# Patient Record
Sex: Female | Born: 1980
Health system: Southern US, Community
[De-identification: ages and names within clinical notes are randomized; demographics above are authoritative.]

## PROBLEM LIST (undated history)

## (undated) DIAGNOSIS — Z789 Other specified health status: Secondary | ICD-10-CM

## (undated) DIAGNOSIS — E669 Obesity, unspecified: Secondary | ICD-10-CM

---
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
## 2000-01-08 ENCOUNTER — Emergency Department (HOSPITAL_COMMUNITY): Admission: EM | Admit: 2000-01-08 | Discharge: 2000-01-08 | Payer: Self-pay | Admitting: Emergency Medicine

## 2000-02-08 ENCOUNTER — Emergency Department (HOSPITAL_COMMUNITY): Admission: EM | Admit: 2000-02-08 | Discharge: 2000-02-09 | Payer: Self-pay | Admitting: Emergency Medicine

## 2000-04-01 ENCOUNTER — Emergency Department (HOSPITAL_COMMUNITY): Admission: EM | Admit: 2000-04-01 | Discharge: 2000-04-02 | Payer: Self-pay

## 2000-04-24 ENCOUNTER — Emergency Department (HOSPITAL_COMMUNITY): Admission: EM | Admit: 2000-04-24 | Discharge: 2000-04-24 | Payer: Self-pay | Admitting: Emergency Medicine

## 2000-06-24 ENCOUNTER — Emergency Department (HOSPITAL_COMMUNITY): Admission: EM | Admit: 2000-06-24 | Discharge: 2000-06-24 | Payer: Self-pay | Admitting: Emergency Medicine

## 2000-06-24 ENCOUNTER — Encounter: Payer: Self-pay | Admitting: Emergency Medicine

## 2000-10-05 ENCOUNTER — Emergency Department (HOSPITAL_COMMUNITY): Admission: EM | Admit: 2000-10-05 | Discharge: 2000-10-05 | Payer: Self-pay | Admitting: Emergency Medicine

## 2000-10-14 ENCOUNTER — Emergency Department (HOSPITAL_COMMUNITY): Admission: EM | Admit: 2000-10-14 | Discharge: 2000-10-14 | Payer: Self-pay | Admitting: Emergency Medicine

## 2001-04-08 ENCOUNTER — Encounter: Payer: Self-pay | Admitting: Emergency Medicine

## 2001-04-08 ENCOUNTER — Emergency Department (HOSPITAL_COMMUNITY): Admission: EM | Admit: 2001-04-08 | Discharge: 2001-04-08 | Payer: Self-pay | Admitting: Emergency Medicine

## 2001-05-24 ENCOUNTER — Emergency Department (HOSPITAL_COMMUNITY): Admission: EM | Admit: 2001-05-24 | Discharge: 2001-05-24 | Payer: Self-pay | Admitting: Emergency Medicine

## 2001-09-12 ENCOUNTER — Emergency Department (HOSPITAL_COMMUNITY): Admission: EM | Admit: 2001-09-12 | Discharge: 2001-09-12 | Payer: Self-pay | Admitting: Emergency Medicine

## 2001-09-12 ENCOUNTER — Encounter: Payer: Self-pay | Admitting: Emergency Medicine

## 2001-09-14 ENCOUNTER — Emergency Department (HOSPITAL_COMMUNITY): Admission: EM | Admit: 2001-09-14 | Discharge: 2001-09-14 | Payer: Self-pay | Admitting: Emergency Medicine

## 2001-11-18 ENCOUNTER — Emergency Department (HOSPITAL_COMMUNITY): Admission: EM | Admit: 2001-11-18 | Discharge: 2001-11-18 | Payer: Self-pay | Admitting: Emergency Medicine

## 2002-05-08 ENCOUNTER — Encounter: Payer: Self-pay | Admitting: Emergency Medicine

## 2002-05-08 ENCOUNTER — Emergency Department (HOSPITAL_COMMUNITY): Admission: EM | Admit: 2002-05-08 | Discharge: 2002-05-08 | Payer: Self-pay | Admitting: Emergency Medicine

## 2002-05-11 ENCOUNTER — Emergency Department (HOSPITAL_COMMUNITY): Admission: EM | Admit: 2002-05-11 | Discharge: 2002-05-11 | Payer: Self-pay | Admitting: Emergency Medicine

## 2002-06-02 ENCOUNTER — Emergency Department (HOSPITAL_COMMUNITY): Admission: EM | Admit: 2002-06-02 | Discharge: 2002-06-02 | Payer: Self-pay | Admitting: Emergency Medicine

## 2002-10-09 ENCOUNTER — Emergency Department (HOSPITAL_COMMUNITY): Admission: EM | Admit: 2002-10-09 | Discharge: 2002-10-09 | Payer: Self-pay

## 2002-10-14 ENCOUNTER — Inpatient Hospital Stay (HOSPITAL_COMMUNITY): Admission: AD | Admit: 2002-10-14 | Discharge: 2002-10-14 | Payer: Self-pay | Admitting: Obstetrics & Gynecology

## 2002-10-18 ENCOUNTER — Inpatient Hospital Stay (HOSPITAL_COMMUNITY): Admission: AD | Admit: 2002-10-18 | Discharge: 2002-10-19 | Payer: Self-pay | Admitting: Obstetrics and Gynecology

## 2002-10-19 ENCOUNTER — Encounter: Payer: Self-pay | Admitting: Obstetrics and Gynecology

## 2002-11-17 ENCOUNTER — Inpatient Hospital Stay (HOSPITAL_COMMUNITY): Admission: AD | Admit: 2002-11-17 | Discharge: 2002-11-17 | Payer: Self-pay | Admitting: Obstetrics & Gynecology

## 2002-11-22 ENCOUNTER — Inpatient Hospital Stay (HOSPITAL_COMMUNITY): Admission: AD | Admit: 2002-11-22 | Discharge: 2002-11-22 | Payer: Self-pay | Admitting: *Deleted

## 2002-12-27 ENCOUNTER — Ambulatory Visit (HOSPITAL_COMMUNITY): Admission: RE | Admit: 2002-12-27 | Discharge: 2002-12-27 | Payer: Self-pay | Admitting: *Deleted

## 2003-02-13 ENCOUNTER — Inpatient Hospital Stay (HOSPITAL_COMMUNITY): Admission: AD | Admit: 2003-02-13 | Discharge: 2003-02-13 | Payer: Self-pay | Admitting: *Deleted

## 2003-02-14 ENCOUNTER — Inpatient Hospital Stay (HOSPITAL_COMMUNITY): Admission: AD | Admit: 2003-02-14 | Discharge: 2003-02-14 | Payer: Self-pay | Admitting: Obstetrics and Gynecology

## 2003-03-01 ENCOUNTER — Inpatient Hospital Stay (HOSPITAL_COMMUNITY): Admission: AD | Admit: 2003-03-01 | Discharge: 2003-03-02 | Payer: Self-pay | Admitting: Obstetrics & Gynecology

## 2003-03-27 ENCOUNTER — Observation Stay (HOSPITAL_COMMUNITY): Admission: AD | Admit: 2003-03-27 | Discharge: 2003-03-28 | Payer: Self-pay | Admitting: Obstetrics

## 2003-04-17 ENCOUNTER — Inpatient Hospital Stay (HOSPITAL_COMMUNITY): Admission: AD | Admit: 2003-04-17 | Discharge: 2003-04-17 | Payer: Self-pay | Admitting: Obstetrics

## 2003-04-21 ENCOUNTER — Inpatient Hospital Stay (HOSPITAL_COMMUNITY): Admission: AD | Admit: 2003-04-21 | Discharge: 2003-04-21 | Payer: Self-pay | Admitting: Obstetrics

## 2003-05-09 ENCOUNTER — Inpatient Hospital Stay (HOSPITAL_COMMUNITY): Admission: AD | Admit: 2003-05-09 | Discharge: 2003-05-09 | Payer: Self-pay | Admitting: Obstetrics

## 2003-05-31 ENCOUNTER — Observation Stay (HOSPITAL_COMMUNITY): Admission: AD | Admit: 2003-05-31 | Discharge: 2003-05-31 | Payer: Self-pay | Admitting: Obstetrics

## 2003-06-01 ENCOUNTER — Inpatient Hospital Stay (HOSPITAL_COMMUNITY): Admission: AD | Admit: 2003-06-01 | Discharge: 2003-06-04 | Payer: Self-pay | Admitting: Obstetrics

## 2003-07-30 ENCOUNTER — Inpatient Hospital Stay (HOSPITAL_COMMUNITY): Admission: AD | Admit: 2003-07-30 | Discharge: 2003-07-30 | Payer: Self-pay | Admitting: Obstetrics

## 2003-08-17 ENCOUNTER — Emergency Department (HOSPITAL_COMMUNITY): Admission: EM | Admit: 2003-08-17 | Discharge: 2003-08-17 | Payer: Self-pay | Admitting: Emergency Medicine

## 2004-07-11 ENCOUNTER — Emergency Department (HOSPITAL_COMMUNITY): Admission: EM | Admit: 2004-07-11 | Discharge: 2004-07-11 | Payer: Self-pay | Admitting: Emergency Medicine

## 2005-06-17 ENCOUNTER — Inpatient Hospital Stay (HOSPITAL_COMMUNITY): Admission: AD | Admit: 2005-06-17 | Discharge: 2005-06-17 | Payer: Self-pay | Admitting: Obstetrics

## 2005-07-02 ENCOUNTER — Ambulatory Visit (HOSPITAL_COMMUNITY): Admission: AD | Admit: 2005-07-02 | Discharge: 2005-07-02 | Payer: Self-pay | Admitting: Obstetrics

## 2005-08-23 ENCOUNTER — Inpatient Hospital Stay (HOSPITAL_COMMUNITY): Admission: AD | Admit: 2005-08-23 | Discharge: 2005-08-23 | Payer: Self-pay | Admitting: Obstetrics

## 2005-09-14 ENCOUNTER — Inpatient Hospital Stay (HOSPITAL_COMMUNITY): Admission: AD | Admit: 2005-09-14 | Discharge: 2005-09-14 | Payer: Self-pay | Admitting: Obstetrics

## 2005-10-30 ENCOUNTER — Inpatient Hospital Stay (HOSPITAL_COMMUNITY): Admission: AD | Admit: 2005-10-30 | Discharge: 2005-10-30 | Payer: Self-pay | Admitting: Obstetrics

## 2005-11-11 ENCOUNTER — Inpatient Hospital Stay (HOSPITAL_COMMUNITY): Admission: AD | Admit: 2005-11-11 | Discharge: 2005-11-11 | Payer: Self-pay | Admitting: Obstetrics

## 2005-11-12 ENCOUNTER — Inpatient Hospital Stay (HOSPITAL_COMMUNITY): Admission: AD | Admit: 2005-11-12 | Discharge: 2005-11-15 | Payer: Self-pay | Admitting: Obstetrics

## 2007-01-31 ENCOUNTER — Emergency Department (HOSPITAL_COMMUNITY): Admission: EM | Admit: 2007-01-31 | Discharge: 2007-01-31 | Payer: Self-pay | Admitting: Emergency Medicine

## 2009-05-24 ENCOUNTER — Emergency Department (HOSPITAL_COMMUNITY): Admission: EM | Admit: 2009-05-24 | Discharge: 2009-05-24 | Payer: Self-pay | Admitting: Emergency Medicine

## 2009-09-23 ENCOUNTER — Emergency Department (HOSPITAL_COMMUNITY): Admission: EM | Admit: 2009-09-23 | Discharge: 2009-09-23 | Payer: Self-pay | Admitting: Emergency Medicine

## 2010-03-22 ENCOUNTER — Encounter: Payer: Self-pay | Admitting: *Deleted

## 2010-05-17 LAB — URINALYSIS, ROUTINE W REFLEX MICROSCOPIC
Glucose, UA: NEGATIVE mg/dL
Nitrite: NEGATIVE
Protein, ur: NEGATIVE mg/dL
Urobilinogen, UA: 0.2 mg/dL (ref 0.0–1.0)

## 2010-05-17 LAB — POCT PREGNANCY, URINE: Preg Test, Ur: NEGATIVE

## 2010-05-17 LAB — URINE CULTURE

## 2010-05-17 LAB — URINE MICROSCOPIC-ADD ON

## 2010-05-26 LAB — HEPATIC FUNCTION PANEL
Alkaline Phosphatase: 55 U/L (ref 39–117)
Total Bilirubin: 0.5 mg/dL (ref 0.3–1.2)

## 2010-05-26 LAB — URINALYSIS, ROUTINE W REFLEX MICROSCOPIC
Bilirubin Urine: NEGATIVE
Ketones, ur: NEGATIVE mg/dL
Nitrite: NEGATIVE
Specific Gravity, Urine: 1.005 (ref 1.005–1.030)
Urobilinogen, UA: 0.2 mg/dL (ref 0.0–1.0)
pH: 7 (ref 5.0–8.0)

## 2010-05-26 LAB — BASIC METABOLIC PANEL
BUN: 14 mg/dL (ref 6–23)
CO2: 28 mEq/L (ref 19–32)
GFR calc Af Amer: >60 mL/min (ref >60)
GFR calc non Af Amer: >60 mL/min (ref >60)
Glucose, Bld: 95 mg/dL (ref 70–99)

## 2010-05-26 LAB — DIFFERENTIAL
Eosinophils Absolute: 0.1 10*3/uL (ref 0.0–0.7)
Lymphs Abs: 2.6 10*3/uL (ref 0.7–4.0)
Monocytes Relative: 9 % (ref 3–12)
Neutrophils Relative %: 67 % (ref 43–77)

## 2010-05-26 LAB — LIPASE, BLOOD: Lipase: 26 U/L (ref 11–59)

## 2010-05-26 LAB — CBC
MCHC: 32.1 g/dL (ref 30.0–36.0)
RDW: 16.7 % — ABNORMAL HIGH (ref 11.5–15.5)
WBC: 11.7 10*3/uL — ABNORMAL HIGH (ref 4.0–10.5)

## 2010-07-17 ENCOUNTER — Emergency Department (HOSPITAL_COMMUNITY)
Admission: EM | Admit: 2010-07-17 | Discharge: 2010-07-17 | Disposition: A | Discharge status: Home / Self Care | Payer: No Typology Code available for payment source | Attending: Emergency Medicine | Admitting: Emergency Medicine

## 2010-07-17 ENCOUNTER — Emergency Department (HOSPITAL_COMMUNITY): Payer: No Typology Code available for payment source

## 2010-07-17 DIAGNOSIS — Y92410 Unspecified street and highway as the place of occurrence of the external cause: Secondary | ICD-10-CM | POA: Insufficient documentation

## 2010-07-17 DIAGNOSIS — O99891 Other specified diseases and conditions complicating pregnancy: Secondary | ICD-10-CM | POA: Insufficient documentation

## 2010-07-17 DIAGNOSIS — R51 Headache: Secondary | ICD-10-CM | POA: Insufficient documentation

## 2010-07-17 DIAGNOSIS — T1490XA Injury, unspecified, initial encounter: Secondary | ICD-10-CM | POA: Insufficient documentation

## 2010-07-17 DIAGNOSIS — N949 Unspecified condition associated with female genital organs and menstrual cycle: Secondary | ICD-10-CM | POA: Insufficient documentation

## 2010-07-17 DIAGNOSIS — M549 Dorsalgia, unspecified: Secondary | ICD-10-CM | POA: Insufficient documentation

## 2010-07-18 NOTE — Op Note (Signed)
NAMERAYSHELL, GOECKE             ACCOUNT NO.:  [id number]   MEDICAL RECORD NO.:  [id number]          PATIENT TYPE:  INP   LOCATION:  9143                          FACILITY:  [facility]   PHYSICIAN:  Kathreen Cosier, M.D.DATE OF BIRTH:  [birth year]   DATE OF PROCEDURE:  11/12/2005  DATE OF DISCHARGE:                                 OPERATIVE REPORT   PREOPERATIVE DIAGNOSIS:  Previous cesarean section, intrauterine pregnancy  36 weeks, in labor.   SURGEON:  Kathreen Cosier, M.D.   ANESTHESIA:  Spinal.   PROCEDURE:  The patient was placed on the operating table in the supine  position.  After the spinal anesthesia, the abdomen was prepped and draped.  The bladder was emptied with a Foley catheter.  A transverse suprapubic  incision was made through the old scar and carried down to the rectus  fascia.  The fascia was incised the length of the incision.  The rectus  muscles were retracted laterally.  The peritoneum was incised  longitudinally.  A transverse incision was made in the visceroperitoneum at  the level of the bladder, the bladder moved superiorly.  A transverse lower  uterine incision was made, the fluid was clear.  The patient delivered from  the LOA position of a female, Apgar 7 and 8.  The placenta was anterior and  removed manually.  The uterine cavity was cleaned with dry laps.  The  uterine incision was closed in one layer with continuous suture of #1  chromic.  Hemostasis was satisfactory. The bladder flap was reattached with  2-0 chromic.  The uterus was intact, tubes and ovaries normal.  The abdomen  was closed in layers, peritoneum with continuous suture of 0 chromic, the  fascia with continuous suture of 4-0 Dexon, the skin was closed with  subcuticular stitch of 4-0 Monocryl.  Blood loss was 800 mL.           ______________________________  Kathreen Cosier, M.D.     BAM/MEDQ  D:  11/12/2005  T:  11/13/2005  Job:  [id number]

## 2010-07-18 NOTE — H&P (Signed)
Karen [name], Karen [name]             ACCOUNT NO.:  [id number]   MEDICAL RECORD NO.:  [id number]          PATIENT TYPE:  INP   LOCATION:  9143                          [facility]ACILITY:  [facility]   PHYSICIAN:  Kathreen Cosier, M.D.DATE OF BIRTH:  05-Mar-1980   DATE OF ADMISSION:  11/12/2005  DATE OF DISCHARGE:                                HISTORY & PHYSICAL   HISTORY OF THE PRESENT ILLNESS:  This patient is a 30 year old gravida 3,  para 2, 0, 0, 2 who had a previous C section and one vaginal delivery.  Her  EDC is December 14, 2005 and she desires repeat C section.  The patient came  to the hospital on November 11, 2005 contracting and her cervix was closed,  and on November 12, 2005 she continued to have contractions and she was  given magnesium sulfate; however, her contractions would not stop.  Her  cervix changed 2 cm so we decided to proceed with a C section.   PHYSICAL EXAMINATION:  GENERAL APPEARANCE:  The physical exam reveals an  obese female weighing 266 pounds who is in early labor.  HEENT:  The head, eyes, ears, nose and throat are negative.  LUNGS:  The lungs are clear.  HEART:  The heart has a regular rhythm.  No murmurs.  No gallops.  BREASTS:  The breasts have no masses.  ABDOMEN:  The abdomen shows a 36-weeks size uterus.  PELVIC:  The pelvic examination is as described above.   The patient is admitted by Dr. Gaynell Face on November 12, 2005.           ______________________________  Kathreen Cosier, M.D.     BAM/MEDQ  D:  11/12/2005  T:  11/12/2005  Job:  [id number]

## 2010-07-18 NOTE — Discharge Summary (Signed)
NAMEOLIVINE, HIERS             ACCOUNT NO.:  [id number]   MEDICAL RECORD NO.:  [id number]          PATIENT TYPE:  INP   LOCATION:  9143                          [facility]ILITY[facility]   PHYSICIAN:  Kathreen Cosier, M.D.DATE OF BIRTH:  1980-06-14   DATE OF ADMISSION:  11/12/2005  DATE OF DISCHARGE:  11/15/2005                                 DISCHARGE SUMMARY   The patient is a 30 year old, gravida 3, para 2-0-0-2.  [facility] December 15, 2005.  She had 1 c-section and desired repeat c-section and tubal ligation.  She was admitted with mild contractions.  Cervix was closed and her  contractions stopped with terbutaline on the day prior to admission.  She  was seen again on September 13 with the cervix 2 cm dilated and she  underwent a repeat low-transverse cesarean section and she had a female,  Apgars 7, 8, weighing 6 pounds 9 ounces.  The patient did not have her tubes  tied.  Postoperatively, she did well, was discharged home on Tylox for pain,  to see me in 6 weeks.   DISCHARGE DIAGNOSES:  Status post repeat low-transverse cesarean section at  36 weeks.           ______________________________  Kathreen Cosier, M.D.     BAM/MEDQ  D:  12/09/2005  T:  12/10/2005  Job:  [id number]

## 2010-07-18 NOTE — H&P (Signed)
[name], [name]                       ACCOUNT NO.:  [id number]   MEDICAL RECORD NO.:  [id number]                   PATIENT TYPE:  MAT   LOCATION:  MATC                                 [facility]ACILITY:  [facility]   PHYSICIAN:  Karen [name], M.D.               DATE OF BIRTH:  01-12-1981   DATE OF ADMISSION:  03/27/2003  DATE OF DISCHARGE:                                HISTORY & PHYSICAL   HISTORY OF PRESENT ILLNESS:  This is a 30 year old black female who  presented by car to [facility].  She apparently slipped and fell at  work.  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  She said she  slipped on some soap or water on the floor.  She must have landed on her  back.  She first went to [facility].  They were closed or  closing, and she then came to the [facility] emergency room.  She presented  about 1-1/2 hour after her injury.  He main complaints are that of lower-  back pain and cramping, lower abdominal pain.  She is pregnant with her  second pregnancy.  It is due June 09, 2003.  Dr. Kathreen Cosier is her  gynecologist.  Actually, she just saw him yesterday.  She has had no prior  abdominal injury.  She denies a history of peptic ulcer disease, liver  disease, pancreatic disease or change in bowel habit.   PAST MEDICAL HISTORY:  She has no allergies.   MEDICATIONS:  Vitamins is her only medicine.   REVIEW OF SYSTEMS:  Neurologic:  No seizure or loss of consciousness.  Pulmonary:  No history of pneumonia or tuberculosis.  Cardiac:  No history  of heart disease or chest pain.  Gastrointestinal:  See history of present  illness.  Urologic:  No history of kidney stones or kidney infection.  She  lives at home with her fiance [name].  She has a 51 year old  daughter at home, and she works as a Lawyer.   PHYSICAL EXAMINATION:  VITAL SIGNS:  Blood pressure 124/75, pulse 100,  respirations 13.  The baby's pulse is running between 145 and 160.   [facility]  [facility] and we have heard nothing as far as any  irregularities at this time.  GENERAL:  Appears that of a well-nourished black female who is in no  extremis, alert, cooperative and talking.  HEENT:  Unremarkable.  NECK:  Supple.  She has no tenderness on neck movement.  No swelling in her  neck.  LUNGS:  Clear to auscultation.  HEART:  Regular rate and rhythm.  ABDOMEN:  She is pregnant up to a level a little bit above her umbilicus.  She is on monitor at this time.  She has active bowel sounds.  EXTREMITIES:  She has no obvious contusion or abrasion of either upper or  lower extremities, and has gross sensation intact.  She has got dirty feet.   LABORATORY DATA:  There are no labs reported at this time.   IMPRESSION:  1. Fall with low-back strain.  We will not plan on any x-rays at this time     unless this is persistent pain.  2. Lower-abdominal pain.  We will be in touch with Dr. Gaynell Face or whoever     is on call for him regarding monitoring her overnight from a obstetrical     standpoint.  3. She has no significant traumatic injury that Karen can see.  This was a fall     at a [name] position, and she has been stable now two hours since her     injury.  She has noticed no vaginal bleeding or other problems.  4. She was in the gold trauma alert because of her pregnancy status.                                                Karen [name], M.D.    DHN/MEDQ  D:  03/27/2003  T:  03/27/2003  Job:  [id number]   cc:   Kathreen Cosier, M.D.  [address]  [place]  [place] [address]  Fax: [phone]

## 2010-08-04 ENCOUNTER — Inpatient Hospital Stay (HOSPITAL_COMMUNITY)
Admission: AD | Admit: 2010-08-04 | Discharge: 2010-08-04 | Discharge status: Against Medical Advice | Payer: Medicaid Other | Source: Ambulatory Visit | Attending: Obstetrics | Admitting: Obstetrics

## 2010-08-04 DIAGNOSIS — O99891 Other specified diseases and conditions complicating pregnancy: Secondary | ICD-10-CM | POA: Insufficient documentation

## 2010-08-04 DIAGNOSIS — R109 Unspecified abdominal pain: Secondary | ICD-10-CM | POA: Insufficient documentation

## 2010-08-04 DIAGNOSIS — O210 Mild hyperemesis gravidarum: Secondary | ICD-10-CM | POA: Insufficient documentation

## 2010-09-07 ENCOUNTER — Encounter (HOSPITAL_COMMUNITY): Payer: Self-pay

## 2010-09-07 ENCOUNTER — Inpatient Hospital Stay (HOSPITAL_COMMUNITY)
Admission: AD | Admit: 2010-09-07 | Discharge: 2010-09-07 | Disposition: A | Discharge status: Home / Self Care | Payer: Medicaid Other | Source: Ambulatory Visit | Attending: Obstetrics | Admitting: Obstetrics

## 2010-09-07 DIAGNOSIS — K5900 Constipation, unspecified: Secondary | ICD-10-CM

## 2010-09-07 DIAGNOSIS — O210 Mild hyperemesis gravidarum: Secondary | ICD-10-CM

## 2010-09-07 DIAGNOSIS — O99891 Other specified diseases and conditions complicating pregnancy: Secondary | ICD-10-CM

## 2010-09-07 DIAGNOSIS — O9989 Other specified diseases and conditions complicating pregnancy, childbirth and the puerperium: Secondary | ICD-10-CM

## 2010-09-07 DIAGNOSIS — O219 Vomiting of pregnancy, unspecified: Secondary | ICD-10-CM

## 2010-09-07 DIAGNOSIS — O99619 Diseases of the digestive system complicating pregnancy, unspecified trimester: Secondary | ICD-10-CM

## 2010-09-07 HISTORY — DX: Other specified health status: Z78.9

## 2010-09-07 LAB — URINALYSIS, ROUTINE W REFLEX MICROSCOPIC
Glucose, UA: NEGATIVE mg/dL
Ketones, ur: NEGATIVE mg/dL
Protein, ur: NEGATIVE mg/dL
Specific Gravity, Urine: >1.03 — ABNORMAL HIGH (ref 1.005–1.030)
Urobilinogen, UA: 0.2 mg/dL (ref 0.0–1.0)
pH: 6 (ref 5.0–8.0)

## 2010-09-07 LAB — URINE MICROSCOPIC-ADD ON

## 2010-09-07 MED ORDER — PROMETHAZINE HCL 25 MG PO TABS: 25.0000 mg | ORAL_TABLET | Freq: Four times a day (QID) | ORAL | Status: AC | PRN

## 2010-09-07 NOTE — Initial Assessments (Signed)
Pt presents to MAU with multiple complaints. States She has not slept in 2 weeks, states she has not pooped in 1 week and has tried several things to help herself go. Pt states she is sick everyday and spits all the time. Pt states she spotted today and that worried her.

## 2010-09-07 NOTE — Progress Notes (Signed)
Patient reports having nausea the entire pregnancy, headaches, not sleeping, constipation LBM 09/03/10 has been eating fresh fruit, water, Miralax

## 2010-09-07 NOTE — ED Provider Notes (Signed)
History   Pt c/o continued nausea, vomits 3-4x/day, constant spitting. Used Zofran, didn't really help, not used x 1 m. Pt has been constipated for weeks, last nl BM 1 wk ago, sm BM 3 days ago. Uses Miralax, stool softener, apple juice off/on.  Pt had pink spotting  this am when went to the [facility], thinks may have a hemorrhoid.  Pt has upper abd pain all the time x 5-6 wks.  Poor sleep x months. Next PNV 7/20.  Chief Complaint  Patient presents with  . Morning Sickness  . Vaginal Bleeding  . Headache   HPI   Past Medical History  Diagnosis Date  . No pertinent past medical history     Past Surgical History  Procedure Date  . Cesarean section     No family history on file.  History  Substance Use Topics  . Smoking status: Never Smoker   . Smokeless tobacco: Never Used  . Alcohol Use:     Allergies: No Known Allergies  Prescriptions prior to admission  Medication Sig Dispense Refill  . prenatal vitamin w/FE, FA (PRENATAL 1 + 1) 27-1 MG TABS Take 1 tablet by mouth daily.          Review of Systems  Constitutional: Positive for malaise/fatigue. Negative for fever and chills.  Gastrointestinal: Positive for nausea, vomiting and constipation.  Genitourinary: Negative for dysuria, urgency and frequency.  Skin: Negative.    Physical Exam   Blood pressure 105/73, pulse 107, temperature 99 F (37.2 C), temperature source Oral, resp. rate 16, height 5\' 6"  (1.676 m), weight 222 lb 6.4 oz (100.88 kg).  Physical Exam  Constitutional: She is oriented to person, place, and time. She appears well-developed and well-nourished.  GI: Soft. Bowel sounds are normal. She exhibits no distension and no mass. There is no tenderness. There is no rebound and no guarding.  Genitourinary: Vagina normal. Uterus is enlarged. No bleeding around the vagina. No vaginal discharge found.       cx closed, no vaginal bleeding  Musculoskeletal: Normal range of motion.  Neurological: She is alert and  oriented to person, place, and time.  Skin: Skin is warm and dry.  Psychiatric: She has a normal mood and affect.    MAU Course  Procedures  MDM SG 1.030, pt keeping po fluids down. Rx for Phenergan tabs for home use, long discussion with pt about constipation. Sm hemorrhoid tag.  No vaginal bleeding, cx closed.  Keep next appt 7/20, call the office with other changes.   Avon Gully. Chemere Steffler 09/07/10 1858

## 2010-09-30 ENCOUNTER — Encounter (HOSPITAL_COMMUNITY): Payer: Self-pay | Admitting: *Deleted

## 2010-09-30 ENCOUNTER — Inpatient Hospital Stay (HOSPITAL_COMMUNITY)
Admission: AD | Admit: 2010-09-30 | Discharge: 2010-10-01 | Disposition: A | Discharge status: Home / Self Care | Payer: Medicaid Other | Source: Ambulatory Visit | Attending: Obstetrics | Admitting: Obstetrics

## 2010-09-30 DIAGNOSIS — R6883 Chills (without fever): Secondary | ICD-10-CM | POA: Insufficient documentation

## 2010-09-30 DIAGNOSIS — O9989 Other specified diseases and conditions complicating pregnancy, childbirth and the puerperium: Secondary | ICD-10-CM | POA: Insufficient documentation

## 2010-09-30 DIAGNOSIS — O219 Vomiting of pregnancy, unspecified: Secondary | ICD-10-CM | POA: Insufficient documentation

## 2010-09-30 LAB — URINALYSIS, ROUTINE W REFLEX MICROSCOPIC
Ketones, ur: 15 mg/dL — AB
Nitrite: NEGATIVE
Protein, ur: NEGATIVE mg/dL
pH: 6 (ref 5.0–8.0)

## 2010-09-30 LAB — URINE MICROSCOPIC-ADD ON

## 2010-09-30 NOTE — Progress Notes (Signed)
Pt states she has had chills for 2 days and feels nauseated-states she noticed her urine was organge colored today

## 2010-09-30 NOTE — Progress Notes (Signed)
Pt reports chills for 2 days and orange urine. Pt states that she was vomitting today.

## 2010-10-01 NOTE — ED Provider Notes (Signed)
History    patient presents today complaining of nausea and vomiting. She also complains of her urine  being an orange color. She denies vaginal discharge or bleeding. She denies lower abdominal pain. Denies fever or any other problems at this time. She states she has medication for nausea at home. However, she has not used it today.  Chief Complaint  Patient presents with  . Chills   HPI  OB History    Grav Para Term Preterm Abortions TAB SAB Ect Mult Living   4 2 0 2 1  1   2       Past Medical History  Diagnosis Date  . No pertinent past medical history     Past Surgical History  Procedure Date  . Cesarean section     No family history on file.  History  Substance Use Topics  . Smoking status: Never Smoker   . Smokeless tobacco: Never Used  . Alcohol Use: No    Allergies: No Known Allergies  Prescriptions prior to admission  Medication Sig Dispense Refill  . prenatal vitamin w/FE, FA (PRENATAL 1 + 1) 27-1 MG TABS Take 1 tablet by mouth daily.        . promethazine (PHENERGAN) 25 MG tablet Take 25 mg by mouth every 6 (six) hours as needed. nausea         Review of Systems  Constitutional: Positive for chills. Negative for fever.  Cardiovascular: Negative for chest pain.  Gastrointestinal: Positive for nausea and vomiting. Negative for abdominal pain, diarrhea and constipation.  Genitourinary: Negative for dysuria, urgency, frequency, hematuria and flank pain.  Neurological: Negative for dizziness and headaches.  Psychiatric/Behavioral: Negative for depression and suicidal ideas.   Physical Exam   Blood pressure 115/64, pulse 112, temperature 99 F (37.2 C), temperature source Oral, resp. rate 18, height 6\' 6"  (1.981 m), weight 227 lb (102.967 kg).  Physical Exam  Constitutional: She is oriented to person, place, and time. She appears well-developed and well-nourished. No distress.  HENT:  Head: Normocephalic and atraumatic.  Eyes: EOM are normal. Pupils are  equal, round, and reactive to light.  Cardiovascular: Normal rate and regular rhythm.  Exam reveals no gallop and no friction rub.   No murmur heard. Respiratory: Effort normal and breath sounds normal. No respiratory distress. She has no wheezes. She has no rales. She exhibits no tenderness.  Neurological: She is alert and oriented to person, place, and time.  Skin: Skin is warm and dry. She is not diaphoretic.  Psychiatric: She has a normal mood and affect. Her behavior is normal. Judgment and thought content normal.    MAU Course  Procedures  Results for orders placed during the hospital encounter of 09/30/10 (from the past 24 hour(s))  URINALYSIS, ROUTINE W REFLEX MICROSCOPIC     Status: Abnormal   Collection Time   09/30/10 11:11 PM      Component Value Range   Color, Urine YELLOW  YELLOW    Appearance CLEAR  CLEAR    Specific Gravity, Urine 1.025  1.005 - 1.030    pH 6.0  5.0 - 8.0    Glucose, UA NEGATIVE  NEGATIVE (mg/dL)   Hgb urine dipstick TRACE (*) NEGATIVE    Bilirubin Urine NEGATIVE  NEGATIVE    Ketones, ur 15 (*) NEGATIVE (mg/dL)   Protein, ur NEGATIVE  NEGATIVE (mg/dL)   Urobilinogen, UA 0.2  0.0 - 1.0 (mg/dL)   Nitrite NEGATIVE  NEGATIVE    Leukocytes, UA NEGATIVE  NEGATIVE  URINE MICROSCOPIC-ADD ON     Status: Normal   Collection Time   09/30/10 11:11 PM      Component Value Range   Squamous Epithelial / LPF RARE  RARE    WBC, UA 0-2  <3 (WBC/hpf)   RBC / HPF 0-2  <3 (RBC/hpf)   Bacteria, UA RARE  RARE    Urine-Other MUCOUS PRESENT      Assessment and Plan  Nausea and vomiting: Did discuss this with her at length. Advised her to take the medication she has a home for nausea. She has a followup scheduled Dr. Gaynell Face. She has no other questions or problems at this time.  Clinton Gallant. Noella Kipnis III, DrHSc, MPAS, PA-C  10/01/2010, 12:30 AM   Henrietta Hoover, PA 10/01/10 1610

## 2010-11-21 ENCOUNTER — Encounter (HOSPITAL_COMMUNITY): Payer: Self-pay

## 2010-11-21 ENCOUNTER — Inpatient Hospital Stay (HOSPITAL_COMMUNITY)
Admission: AD | Admit: 2010-11-21 | Discharge: 2010-11-21 | Disposition: A | Discharge status: Home / Self Care | Payer: Medicaid Other | Source: Ambulatory Visit | Attending: Obstetrics | Admitting: Obstetrics

## 2010-11-21 DIAGNOSIS — A499 Bacterial infection, unspecified: Secondary | ICD-10-CM | POA: Insufficient documentation

## 2010-11-21 DIAGNOSIS — B9689 Other specified bacterial agents as the cause of diseases classified elsewhere: Secondary | ICD-10-CM | POA: Insufficient documentation

## 2010-11-21 DIAGNOSIS — N760 Acute vaginitis: Secondary | ICD-10-CM | POA: Insufficient documentation

## 2010-11-21 DIAGNOSIS — O2390 Unspecified genitourinary tract infection in pregnancy, unspecified trimester: Secondary | ICD-10-CM | POA: Insufficient documentation

## 2010-11-21 LAB — URINE MICROSCOPIC-ADD ON

## 2010-11-21 LAB — URINALYSIS, ROUTINE W REFLEX MICROSCOPIC
Glucose, UA: NEGATIVE mg/dL
Ketones, ur: NEGATIVE mg/dL
Protein, ur: NEGATIVE mg/dL
Specific Gravity, Urine: 1.01 (ref 1.005–1.030)
pH: 6.5 (ref 5.0–8.0)

## 2010-11-21 LAB — WET PREP, GENITAL

## 2010-11-21 LAB — POCT FERN TEST: Fern Test: NEGATIVE

## 2010-11-21 LAB — AMNISURE RUPTURE OF MEMBRANE (ROM) NOT AT ARMC: Amnisure ROM: NEGATIVE

## 2010-11-21 MED ORDER — METRONIDAZOLE 500 MG PO TABS: 500.0000 mg | ORAL_TABLET | Freq: Two times a day (BID) | ORAL | Status: AC

## 2010-11-21 NOTE — ED Provider Notes (Signed)
Karen [name] is a 74 [id number] female at 26.2 weeks presenting for one episode of leaking clear fluid down her leg last night w/ no leaking since. She reports rare, mild UC's, pos FM and denies VB. History OB History    Grav Para Term Preterm Abortions TAB SAB Ect Mult Living   4 2 1 1 1  1   2      Past Medical History  Diagnosis Date  . No pertinent past medical history    Past Surgical History  Procedure Date  . Cesarean section    Family History: family history is not on file. Social History:  reports that she has never smoked. She has never used smokeless tobacco. She reports that she does not drink alcohol or use illicit drugs.  ROS: Otherwise neg  Dilation: Closed Effacement (%): Thick Station: -3 Exam by:: V Lexander Tremblay CNM Blood pressure 116/70, pulse 114, temperature 99.1 F (37.3 C), temperature source Oral, resp. rate 22, height 5' 4.5" (1.638 m), weight 112.492 kg (248 lb), SpO2 100.00%. Maternal Exam:  Uterine Assessment: None  Abdomen: Patient reports no abdominal tenderness. Fundal height is S=D.    Introitus: Normal vulva. Vagina is positive for vaginal discharge (thin, white, malodorous.).  Ferning test: negative.   Pelvis: adequate for delivery.   Cervix: Cervix evaluated by sterile speculum exam and digital exam.     Fetal Exam Fetal Monitor Review: Mode: ultrasound.   Baseline rate: 140.  Variability: moderate (6-25 bpm).   Pattern: no accelerations and no decelerations.    Fetal State Assessment: Category I - tracings are normal.     Physical Exam  Constitutional: She is oriented to person, place, and time. She appears well-developed and well-nourished.  Cardiovascular: Normal rate.   Respiratory: Effort normal.  GI: Soft.  Genitourinary: Uterus is not tender. Cervix exhibits no motion tenderness, no discharge and no friability. No bleeding around the vagina. Vaginal discharge (thin, white, malodorous.) found.  Neurological: She is alert  and oriented to person, place, and time.  Skin: Skin is warm and dry.  Psychiatric: She has a normal mood and affect.    Prenatal labs: ABO, Rh:   Antibody:   Rubella:   RPR:    HBsAg:    HIV:    GBS:    Results for orders placed during the hospital encounter of 11/21/10 (from the past 24 hour(s))  URINALYSIS, ROUTINE W REFLEX MICROSCOPIC     Status: Abnormal   Collection Time   11/21/10  5:36 PM      Component Value Range   Color, Urine YELLOW  YELLOW    Appearance CLEAR  CLEAR    Specific Gravity, Urine 1.010  1.005 - 1.030    pH 6.5  5.0 - 8.0    Glucose, UA NEGATIVE  NEGATIVE (mg/dL)   Hgb urine dipstick TRACE (*) NEGATIVE    Bilirubin Urine NEGATIVE  NEGATIVE    Ketones, ur NEGATIVE  NEGATIVE (mg/dL)   Protein, ur NEGATIVE  NEGATIVE (mg/dL)   Urobilinogen, UA 0.2  0.0 - 1.0 (mg/dL)   Nitrite NEGATIVE  NEGATIVE    Leukocytes, UA NEGATIVE  NEGATIVE   URINE MICROSCOPIC-ADD ON     Status: Abnormal   Collection Time   11/21/10  5:36 PM      Component Value Range   Squamous Epithelial / LPF FEW (*) RARE    WBC, UA 0-2  <3 (WBC/hpf)  WET PREP, GENITAL     Status: Abnormal   Collection Time  11/21/10  5:52 PM      Component Value Range   Yeast, Wet Prep NONE SEEN  NONE SEEN    Trich, Wet Prep NONE SEEN  NONE SEEN    Clue Cells, Wet Prep FEW (*) NONE SEEN    WBC, Wet Prep HPF POC FEW (*) NONE SEEN   POCT FERN TEST     Status: Normal   Collection Time   11/21/10  6:40 PM      Component Value Range   Fern Test Negative    AMNISURE RUPTURE OF MEMBRANE (ROM)     Status: Normal   Collection Time   11/21/10  6:55 PM      Component Value Range   Amnisure ROM NEGATIVE     Assessment/Plan: Assessment: 1. Intact membranes 2. BV 3. FHR appropriate for gestation   Plan: 1. D/C home per consult w/ Dr. Tamela Oddi 2. F/U AS 12/06/10 w/ Dr. Gaynell Face 3. PTL precautions   Feras Gardella 11/21/2010, 6:30 PM

## 2010-11-21 NOTE — Progress Notes (Signed)
Pt states that on 9-16 had leaking of creamy fluid. Again today had clear fluid leaking down legs. No pain.

## 2010-11-23 LAB — URINE CULTURE

## 2010-12-02 ENCOUNTER — Inpatient Hospital Stay (HOSPITAL_COMMUNITY)
Admission: AD | Admit: 2010-12-02 | Discharge: 2010-12-02 | Disposition: A | Discharge status: Home / Self Care | Payer: Medicaid Other | Source: Ambulatory Visit | Attending: Obstetrics | Admitting: Obstetrics

## 2010-12-02 ENCOUNTER — Encounter (HOSPITAL_COMMUNITY): Payer: Self-pay | Admitting: *Deleted

## 2010-12-02 DIAGNOSIS — B9689 Other specified bacterial agents as the cause of diseases classified elsewhere: Secondary | ICD-10-CM | POA: Insufficient documentation

## 2010-12-02 DIAGNOSIS — B3731 Acute candidiasis of vulva and vagina: Secondary | ICD-10-CM | POA: Insufficient documentation

## 2010-12-02 DIAGNOSIS — B373 Candidiasis of vulva and vagina: Secondary | ICD-10-CM

## 2010-12-02 DIAGNOSIS — A499 Bacterial infection, unspecified: Secondary | ICD-10-CM | POA: Insufficient documentation

## 2010-12-02 DIAGNOSIS — O2390 Unspecified genitourinary tract infection in pregnancy, unspecified trimester: Secondary | ICD-10-CM | POA: Insufficient documentation

## 2010-12-02 DIAGNOSIS — O479 False labor, unspecified: Secondary | ICD-10-CM

## 2010-12-02 DIAGNOSIS — N760 Acute vaginitis: Secondary | ICD-10-CM | POA: Insufficient documentation

## 2010-12-02 LAB — WET PREP, GENITAL: Trich, Wet Prep: NONE SEEN

## 2010-12-02 LAB — FETAL FIBRONECTIN: Fetal Fibronectin: NEGATIVE

## 2010-12-02 MED ORDER — METRONIDAZOLE 500 MG PO TABS: 500.0000 mg | ORAL_TABLET | Freq: Two times a day (BID) | ORAL | Status: AC

## 2010-12-02 MED ORDER — FLUCONAZOLE 150 MG PO TABS: 150.0000 mg | ORAL_TABLET | Freq: Once | ORAL | Status: AC

## 2010-12-02 NOTE — ED Provider Notes (Signed)
History   Pt presents today c/o possible ctx and a thick vag dc. She states she began having what she thought was ctx last pm and now she is having pain that comes and goes in her back. She denies vag bleeding but does report a vag dc with odor. She reports her last episode of intercourse was about 3wks ago. She has a hx of a preterm delivery.  Chief Complaint  Patient presents with  . Contractions   HPI  OB History    Grav Para Term Preterm Abortions TAB SAB Ect Mult Living   4 2 1 1 1  1   2       Past Medical History  Diagnosis Date  . No pertinent past medical history     Past Surgical History  Procedure Date  . Cesarean section     No family history on file.  History  Substance Use Topics  . Smoking status: Never Smoker   . Smokeless tobacco: Never Used  . Alcohol Use: No    Allergies: No Known Allergies  Prescriptions prior to admission  Medication Sig Dispense Refill  . bisacodyl (DULCOLAX) 10 MG suppository Place 10 mg rectally as needed. bm       . prenatal vitamin w/FE, FA (PRENATAL 1 + 1) 27-1 MG TABS Take 1 tablet by mouth daily.       . metroNIDAZOLE (FLAGYL) 500 MG tablet Take 1 tablet (500 mg total) by mouth 2 (two) times daily.  14 tablet  0    Review of Systems  Constitutional: Negative for fever.  Cardiovascular: Negative for chest pain.  Gastrointestinal: Positive for abdominal pain. Negative for nausea, vomiting, diarrhea and constipation.  Genitourinary: Negative for dysuria, urgency, frequency and hematuria.  Neurological: Negative for dizziness and headaches.  Psychiatric/Behavioral: Negative for depression and suicidal ideas.   Physical Exam   Blood pressure 108/66, pulse 97, temperature 99.2 F (37.3 C), temperature source Oral, resp. rate 20.  Physical Exam  Constitutional: She is oriented to person, place, and time. She appears well-developed and well-nourished. No distress.  HENT:  Head: Normocephalic and atraumatic.  Eyes: Pupils  are equal, round, and reactive to light.  GI: Soft. She exhibits no distension. There is no tenderness. There is no rebound and no guarding.  Genitourinary: No bleeding around the vagina. Vaginal discharge found.       Ffn done prior to digital exam. Cervix Lg/closed.  Neurological: She is alert and oriented to person, place, and time.  Skin: Skin is warm and dry. She is not diaphoretic.  Psychiatric: She has a normal mood and affect. Her behavior is normal. Judgment and thought content normal.    MAU Course  Procedures  Ffn, GC/Chlamydia cultures done.  Results for orders placed during the hospital encounter of 12/02/10 (from the past 24 hour(s))  WET PREP, GENITAL     Status: Abnormal   Collection Time   12/02/10 10:50 AM      Component Value Range   Yeast, Wet Prep FEW (*) NONE SEEN    Trich, Wet Prep NONE SEEN  NONE SEEN    Clue Cells, Wet Prep MODERATE (*) NONE SEEN    WBC, Wet Prep HPF POC FEW (*) NONE SEEN   FETAL FIBRONECTIN     Status: Normal   Collection Time   12/02/10 10:50 AM      Component Value Range   Fetal Fibronectin NEGATIVE  NEGATIVE    NST reactive for 27wks with no ctx.  Assessment and Plan  Yeast/BV: discussed with pt at length. Will tx with diflucan and flagyl. Warned of antabuse reaction. She has a f/u appt scheduled with Dr. Gaynell Face. Discussed diet, activity,risks, and precautions.  Clinton Gallant. Aniela Caniglia III, DrHSc, MPAS, PA-C  12/02/2010, 10:59 AM   Henrietta Hoover, PA 12/02/10 1216

## 2010-12-02 NOTE — Progress Notes (Signed)
Pt C/O uc's since last night, continues this a.m., thick mucus discharge, no bleeding or LOF.

## 2010-12-02 NOTE — Discharge Instructions (Signed)
Bacterial Vaginosis (BV)  Bacterial vaginosis (BV) is a vaginal infection where the normal balance of bacteria in the vagina is disrupted. The normal balance is then replaced by an overgrowth of certain bacteria. There are several different kinds of bacteria that can cause BV. BV is the most common vaginal infection in women of childbearing age.  CAUSES   The cause of BV is not fully understood. BV develops when there is an increase or imbalance of harmful bacteria.    Some activities or behaviors can upset the normal balance of bacteria in the vagina and put women at increased risk including:    Having a new sex partner or multiple sex partners.    Douching.    Using an intrauterine device (IUD) for contraception.    It is not clear what role sexual activity plays in the development of BV. However, women that have never had sexual intercourse are rarely infected with BV.   Women do not get BV from toilet seats, bedding, swimming pools or from touching objects around them.   SYMPTOMS   Grey vaginal discharge.    A fish like odor with discharge, especially after sexual intercourse.    Itching or burning of the vagina and vulva.    Burning or pain with urination.    Some women have no signs or symptoms at all.   DIAGNOSIS   Your caregiver must examine the vagina for signs of BV. Your caregiver will perform lab tests and look at the sample of vaginal fluid through a microscope. They will look for bacteria and abnormal cells (clue cells), a pH test higher than 4.5, and a positive amine test all associated with BV.   RISKS AND COMPLICATIONS   Pelvic inflammatory disease (PID).    Infections following gynecology surgery.    Developing HIV.    Developing herpes virus.   TREATMENT  Sometimes BV will clear up without treatment. However, all women with symptoms of BV should be treated to avoid complications, especially if gynecology surgery is planned. Female partners generally do not need to be treated. However,  BV may spread between female sex partners so treatment is helpful in preventing a recurrence of BV.    BV may be treated with medicines that kill germs (antibiotics). The antibiotics come in either pill or vaginal cream forms. Either can be used with non-pregnant or pregnant women, but the recommended dosages differ. These antibiotics are not harmful to the baby.    BV can recur after treatment. If this happens, a second course of antibiotics will often be prescribed.    Treatment is important for pregnant women. If not treated, BV can cause a premature delivery, especially for a pregnant woman who had a premature birth in the past. All pregnant women who have symptoms of BV should be checked and treated.    For chronic reoccurrence of BV, treatment with a type of prescribed gel vaginally twice a week is helpful.   HOME CARE INSTRUCTIONS   Finish all medication as directed by your caregiver.    Do not have sex until treatment is completed.    Tell your sexual partner that you have a vaginal infection. They should see their caregiver and be treated if they have problems, such as a mild rash or itching.    Practice safe sex. Use condoms. Only have one sex partner.   PREVENTION  Basic prevention steps can help reduce the risk of upsetting the natural balance of bacteria in the vagina and   developing BV:   Do not have sexual intercourse (be abstinent).    Do not douche.    Use all of the medicine prescribed for treatment of BV, even if the signs and symptoms go away.    Tell your sex partner if you have BV. That way, they can be treated, if needed, to prevent reoccurrence.   SEEK MEDICAL CARE IF:   Your symptoms are not improving after three days of treatment.    You have increased discharge, pain or fever.   MAKE SURE YOU:    Understand these instructions.    Will watch your condition.    Will get help right away if you are not doing well or get worse.   FOR MORE INFORMATION  Division of STD Prevention  (DSTDP)  Centers for Disease Control and Prevention  www.cdc.gov/std  Order Publications Online at www.cdc.gov/std/pubs/  CDC National Prevention Information Network (NPIN)  E-mail: info@cdcnpin.org  www.cdcnpin.org    American Social Health Association (ASHA)  [address] [address]  [place], [place] [address]  www.ashastd.org   Document Released: 02/16/2005 Document Re-Released: 05/13/2009  ExitCare Patient Information 2011 [employer].

## 2010-12-25 ENCOUNTER — Inpatient Hospital Stay (HOSPITAL_COMMUNITY)
Admission: AD | Admit: 2010-12-25 | Discharge: 2010-12-27 | DRG: 778 | Disposition: A | Discharge status: Home / Self Care | Payer: Medicaid Other | Source: Ambulatory Visit | Attending: Obstetrics | Admitting: Obstetrics

## 2010-12-25 DIAGNOSIS — O2390 Unspecified genitourinary tract infection in pregnancy, unspecified trimester: Secondary | ICD-10-CM | POA: Diagnosis present

## 2010-12-25 DIAGNOSIS — A499 Bacterial infection, unspecified: Secondary | ICD-10-CM | POA: Diagnosis present

## 2010-12-25 DIAGNOSIS — O4700 False labor before 37 completed weeks of gestation, unspecified trimester: Principal | ICD-10-CM | POA: Diagnosis present

## 2010-12-25 DIAGNOSIS — N760 Acute vaginitis: Secondary | ICD-10-CM | POA: Diagnosis present

## 2010-12-25 DIAGNOSIS — B9689 Other specified bacterial agents as the cause of diseases classified elsewhere: Secondary | ICD-10-CM | POA: Diagnosis present

## 2010-12-25 NOTE — Progress Notes (Signed)
Pt states she has been feeling conrtactions for the past hour

## 2010-12-26 ENCOUNTER — Inpatient Hospital Stay (HOSPITAL_COMMUNITY): Payer: Medicaid Other

## 2010-12-26 ENCOUNTER — Encounter (HOSPITAL_COMMUNITY): Payer: Self-pay | Admitting: *Deleted

## 2010-12-26 LAB — WET PREP, GENITAL
Trich, Wet Prep: NONE SEEN
Yeast Wet Prep HPF POC: NONE SEEN

## 2010-12-26 LAB — URINALYSIS, ROUTINE W REFLEX MICROSCOPIC
Bilirubin Urine: NEGATIVE
Nitrite: NEGATIVE
Specific Gravity, Urine: 1.01 (ref 1.005–1.030)
pH: 7.5 (ref 5.0–8.0)

## 2010-12-26 LAB — URINE MICROSCOPIC-ADD ON

## 2010-12-26 LAB — GC/CHLAMYDIA PROBE AMP, URINE: GC Probe Amp, Urine: NEGATIVE

## 2010-12-26 LAB — FETAL FIBRONECTIN: Fetal Fibronectin: NEGATIVE

## 2010-12-26 MED ORDER — DOCUSATE SODIUM 100 MG PO CAPS: 100.0000 mg | ORAL_CAPSULE | Freq: Every day | ORAL | Status: DC

## 2010-12-26 MED ORDER — MAGNESIUM SULFATE 40 G IN LACTATED RINGERS - SIMPLE
2.0000 g/h | INTRAVENOUS | Status: DC
  Administered 2010-12-26: 2 g/h via INTRAVENOUS
  Filled 2010-12-26: qty 500

## 2010-12-26 MED ORDER — BETAMETHASONE SOD PHOS & ACET 6 (3-3) MG/ML IJ SUSP
12.0000 mg | Freq: Once | INTRAMUSCULAR | Status: AC
  Administered 2010-12-26: 12 mg via INTRAMUSCULAR
  Filled 2010-12-26: qty 2

## 2010-12-26 MED ORDER — METRONIDAZOLE 500 MG PO TABS
500.0000 mg | ORAL_TABLET | Freq: Two times a day (BID) | ORAL | Status: DC
  Administered 2010-12-26: 500 mg via ORAL
  Filled 2010-12-26: qty 1

## 2010-12-26 MED ORDER — CALCIUM CARBONATE ANTACID 500 MG PO CHEW: 2.0000 | CHEWABLE_TABLET | ORAL | Status: DC | PRN

## 2010-12-26 MED ORDER — METRONIDAZOLE 500 MG PO TABS
500.0000 mg | ORAL_TABLET | Freq: Two times a day (BID) | ORAL | Status: DC
  Administered 2010-12-26 (×2): 500 mg via ORAL
  Filled 2010-12-26 (×4): qty 1

## 2010-12-26 MED ORDER — ZOLPIDEM TARTRATE 10 MG PO TABS: 10.0000 mg | ORAL_TABLET | Freq: Every evening | ORAL | Status: DC | PRN

## 2010-12-26 MED ORDER — NIFEDIPINE ER 60 MG PO TB24
60.0000 mg | ORAL_TABLET | Freq: Every day | ORAL | Status: DC
  Administered 2010-12-26 – 2010-12-27 (×2): 60 mg via ORAL
  Filled 2010-12-26 (×3): qty 1

## 2010-12-26 MED ORDER — ONDANSETRON 4 MG PO TBDP
4.0000 mg | ORAL_TABLET | Freq: Three times a day (TID) | ORAL | Status: DC | PRN
  Administered 2010-12-26: 4 mg via ORAL
  Filled 2010-12-26 (×2): qty 1

## 2010-12-26 MED ORDER — BETAMETHASONE SOD PHOS & ACET 6 (3-3) MG/ML IJ SUSP
12.0000 mg | Freq: Once | INTRAMUSCULAR | Status: AC
  Administered 2010-12-27: 12 mg via INTRAMUSCULAR
  Filled 2010-12-26: qty 2

## 2010-12-26 MED ORDER — MAGNESIUM SULFATE 40 G IN LACTATED RINGERS - SIMPLE
2.5000 g/h | INTRAVENOUS | Status: DC
  Administered 2010-12-26 (×2): 2.5 g/h via INTRAVENOUS
  Filled 2010-12-26 (×2): qty 500

## 2010-12-26 MED ORDER — NIFEDIPINE 10 MG PO CAPS: 10.0000 mg | ORAL_CAPSULE | Freq: Once | ORAL | Status: DC

## 2010-12-26 MED ORDER — LACTATED RINGERS IV SOLN
INTRAVENOUS | Status: DC
  Administered 2010-12-26 (×3): via INTRAVENOUS

## 2010-12-26 MED ORDER — PRENATAL PLUS 27-1 MG PO TABS: 1.0000 | ORAL_TABLET | Freq: Every day | ORAL | Status: DC

## 2010-12-26 MED ORDER — LACTATED RINGERS IV BOLUS (SEPSIS)
1000.0000 mL | Freq: Once | INTRAVENOUS | Status: AC
  Administered 2010-12-26: 1000 mL via INTRAVENOUS

## 2010-12-26 MED ORDER — MAGNESIUM SULFATE BOLUS VIA INFUSION
4.0000 g | Freq: Once | INTRAVENOUS | Status: DC
Start: 2010-12-26 — End: 2010-12-26
  Administered 2010-12-26: 4 g via INTRAVENOUS
  Filled 2010-12-26: qty 500

## 2010-12-26 MED ORDER — MAGNESIUM SULFATE 40 G IN LACTATED RINGERS - SIMPLE
2.0000 g/h | INTRAVENOUS | Status: DC
  Filled 2010-12-26: qty 500

## 2010-12-26 MED ORDER — NIFEDIPINE 10 MG PO CAPS
20.0000 mg | ORAL_CAPSULE | Freq: Once | ORAL | Status: AC
  Administered 2010-12-26: 20 mg via ORAL
  Filled 2010-12-26 (×2): qty 1

## 2010-12-26 MED ORDER — DOCUSATE SODIUM 100 MG PO CAPS
100.0000 mg | ORAL_CAPSULE | Freq: Every day | ORAL | Status: DC
  Filled 2010-12-26 (×2): qty 1

## 2010-12-26 MED ORDER — LACTATED RINGERS IV SOLN
INTRAVENOUS | Status: DC
  Administered 2010-12-26: 02:00:00 via INTRAVENOUS

## 2010-12-26 MED ORDER — ACETAMINOPHEN 325 MG PO TABS: 650.0000 mg | ORAL_TABLET | ORAL | Status: DC | PRN

## 2010-12-26 MED ORDER — CALCIUM CARBONATE ANTACID 500 MG PO CHEW
2.0000 | CHEWABLE_TABLET | ORAL | Status: DC | PRN
  Filled 2010-12-26: qty 2

## 2010-12-26 MED ORDER — PRENATAL PLUS 27-1 MG PO TABS
1.0000 | ORAL_TABLET | Freq: Every day | ORAL | Status: DC
  Filled 2010-12-26 (×2): qty 1

## 2010-12-26 NOTE — Progress Notes (Signed)
UR Chart review completed.  

## 2010-12-26 NOTE — Progress Notes (Signed)
  Vital signs normal patient is now on 3 g of magnesium and her contractions have stopped The magnesium was decreased to 2.5 g per hour Patient has no complaints

## 2010-12-26 NOTE — ED Provider Notes (Signed)
History   Pt presents today c/o ctx that began about 1 hour ago. She states the ctx are about every 5 min apart and have been very strong. She denies recent intercourse, vag dc, bleeding, or any other sx. She reports GFM.  Chief Complaint  Patient presents with  . Contractions   HPI  OB History    Grav Para Term Preterm Abortions TAB SAB Ect Mult Living   4 2 1 1 1  1   2       Past Medical History  Diagnosis Date  . No pertinent past medical history     Past Surgical History  Procedure Date  . Cesarean section     No family history on file.  History  Substance Use Topics  . Smoking status: Never Smoker   . Smokeless tobacco: Never Used  . Alcohol Use: No    Allergies: No Known Allergies  Prescriptions prior to admission  Medication Sig Dispense Refill  . bisacodyl (DULCOLAX) 10 MG suppository Place 10 mg rectally as needed. bm       . prenatal vitamin w/FE, FA (PRENATAL 1 + 1) 27-1 MG TABS Take 1 tablet by mouth daily.         Review of Systems  Constitutional: Negative for fever.  Cardiovascular: Negative for chest pain.  Gastrointestinal: Positive for abdominal pain and diarrhea. Negative for nausea, vomiting, constipation and blood in stool.  Genitourinary: Negative for dysuria, urgency, frequency and hematuria.  Neurological: Negative for dizziness and headaches.  Psychiatric/Behavioral: Negative for depression and suicidal ideas.   Physical Exam   Blood pressure 105/64, pulse 102, temperature 98.9 F (37.2 C), temperature source Oral, resp. rate 20, height 5\' 6"  (1.676 m), weight 252 lb (114.306 kg), SpO2 99.00%.  Physical Exam  Nursing note and vitals reviewed. Constitutional: She is oriented to person, place, and time. She appears well-developed and well-nourished. No distress.  HENT:  Head: Normocephalic and atraumatic.  Eyes: EOM are normal. Pupils are equal, round, and reactive to light.  GI: Soft. She exhibits no distension and no mass. There is  no tenderness. There is no rebound and no guarding.  Genitourinary: No bleeding around the vagina. Vaginal discharge found.       Cervix Lg/closed.  Neurological: She is alert and oriented to person, place, and time.  Skin: Skin is warm and dry. She is not diaphoretic.  Psychiatric: She has a normal mood and affect. Her behavior is normal. Judgment and thought content normal.    MAU Course  Procedures  Discussed pt with Dr. Dion Body. Will start IV and give procardia.  Results for orders placed during the hospital encounter of 12/25/10 (from the past 24 hour(s))  URINALYSIS, ROUTINE W REFLEX MICROSCOPIC     Status: Abnormal   Collection Time   12/25/10 11:30 PM      Component Value Range   Color, Urine YELLOW  YELLOW    Appearance CLEAR  CLEAR    Specific Gravity, Urine 1.010  1.005 - 1.030    pH 7.5  5.0 - 8.0    Glucose, UA NEGATIVE  NEGATIVE (mg/dL)   Hgb urine dipstick TRACE (*) NEGATIVE    Bilirubin Urine NEGATIVE  NEGATIVE    Ketones, ur NEGATIVE  NEGATIVE (mg/dL)   Protein, ur NEGATIVE  NEGATIVE (mg/dL)   Urobilinogen, UA 0.2  0.0 - 1.0 (mg/dL)   Nitrite NEGATIVE  NEGATIVE    Leukocytes, UA NEGATIVE  NEGATIVE   URINE MICROSCOPIC-ADD ON  Status: Normal   Collection Time   12/25/10 11:30 PM      Component Value Range   Squamous Epithelial / LPF RARE  RARE    RBC / HPF 0-2  <3 (RBC/hpf)  FETAL FIBRONECTIN     Status: Normal   Collection Time   12/26/10 12:21 AM      Component Value Range   Fetal Fibronectin NEGATIVE  NEGATIVE   WET PREP, GENITAL     Status: Abnormal   Collection Time   12/26/10 12:21 AM      Component Value Range   Yeast, Wet Prep NONE SEEN  NONE SEEN    Trich, Wet Prep NONE SEEN  NONE SEEN    Clue Cells, Wet Prep MANY (*) NONE SEEN    WBC, Wet Prep HPF POC MODERATE (*) NONE SEEN    NST reactive but pt continues to contract every 2-4 min despite procardia. Discussed with Dr. Dion Body. Will admit for MgSO4. Assessment and Plan  TPTL:  admit.  Clinton Gallant. Rice III, DrHSc, MPAS, PA-C  12/26/2010, 12:22 AM   Henrietta Hoover, PA 12/26/10 [phone]

## 2010-12-26 NOTE — H&P (Signed)
Karen [name] is a 30 y.o. female presenting for painful contractions that started 2 hours prior to admission.  H/o PT delivery.  Upon arrival, pt was contracting q 2 min and very uncomfortable per PA in MAU.  Contractions did not resolve with Procardia 20 mg.  FFN neg and cervix was long, thick and closed. Magnesium started for tocolysis due to regular painful contractions.  When contractions still every 5 minutes and painful after 4 gm load and 2 gram hourly, BMZ ordered. Maternal Medical History:  Reason for admission: Reason for admission: contractions and nausea.  Contractions: Onset was 1-2 hours ago.   Frequency: irregular.   Duration is approximately 5 minutes.   Perceived severity is strong.    Prenatal complications: No preterm labor.     OB History    Grav Para Term Preterm Abortions TAB SAB Ect Mult Living   4 2 1 1 1  0 1 0 0 2     H/o Preterm delivery Past Medical History  Diagnosis Date  . No pertinent past medical history    Past Surgical History  Procedure Date  . Cesarean section    Family History: family history is not on file. Social History:  reports that she has never smoked. She has never used smokeless tobacco. She reports that she does not drink alcohol or use illicit drugs.  Review of Systems  Constitutional: Negative for fever and chills.  Eyes: Negative for blurred vision.  Respiratory: Negative for shortness of breath.   Gastrointestinal: Positive for nausea and diarrhea. Negative for vomiting.  Neurological: Negative for headaches.    Dilation: Closed Effacement (%): Thick Exam by:: EASTON RICE, PA Blood pressure 91/54, pulse 105, temperature 98.5 F (36.9 C), temperature source Oral, resp. rate 18, height 5\' 6"  (1.676 m), weight 114.306 kg (252 lb), SpO2 99.00%. Maternal Exam:  Uterine Assessment: Contraction strength is moderate.  Contraction duration is 2 minutes. Contraction frequency is regular.  NST not available from MAU for  review.  Abdomen: Patient reports no abdominal tenderness. Fetal presentation: vertex  Introitus: Normal vulva. Ferning test: not done.  Nitrazine test: not done.  Cervix: Cervix evaluated by digital exam.     Fetal Exam Fetal Monitor Review: Baseline rate: 150s.  Variability: moderate (6-25 bpm).   Pattern: accelerations present.    Fetal State Assessment: Category I - tracings are normal.    contractions now irregular 10 minutes. Physical Exam  Constitutional: She is oriented to person, place, and time. She appears well-developed and well-nourished.       obese  HENT:  Head: Normocephalic and atraumatic.  Eyes: Conjunctivae and EOM are normal.  Neck: Normal range of motion.  Cardiovascular: Normal rate and regular rhythm.   Respiratory: Effort normal and breath sounds normal.  GI: Soft.       No fundal tenderness.  Musculoskeletal: She exhibits no edema.  Neurological: She is alert and oriented to person, place, and time.       DTR not illicited  Skin: Skin is warm and dry.    Wet prep many clue cells, mod wbc, no trich or yeast.  GC/Chl, Urine culture pending.  Prenatal labs: ABO, Rh:   Antibody:   Rubella:   RPR:    HBsAg:    HIV:    GBS:     Assessment/Plan: Preterm contractions with h/o preterm delivery.  On Magnesium sulfate, contractions spaced out.  No symptoms of magnesium toxicity.  Mg level at 07:45. Cervix closed and unchanged but soft.  Objective data supports  delivery within in 10 days is unlikely but clinically pt very uncomfortable. BV may be contributing to contractions.  On Flagyl 500 mg BID x 7 days. BMZ in process.  First dose given at ~4:15. Will check out to Dr. Gaynell Face for further management.  Geryl Rankins 12/26/2010, 6:52 AM

## 2010-12-26 NOTE — Progress Notes (Signed)
Called to room and pt had vomitted approx 600cc emesis.  Pt also complain that during that she felt fluid running down her leg.  Amniswab positive.

## 2010-12-26 NOTE — Progress Notes (Signed)
TRANSFER TO [facility] 153 VIA W/C

## 2010-12-26 NOTE — Progress Notes (Signed)
CALLED REPORT  TO  [facility]

## 2010-12-27 NOTE — Progress Notes (Signed)
  Vital signs normal Her is her contractions have basically stopped the magnesium was discontinued yesterday and she is now on Procardia 60 XL once daily she can go home today and and see me in 2 weeks

## 2010-12-27 NOTE — Discharge Summary (Signed)
  Patient is [redacted] weeks pregnant and was admitted with premature contractions which did not stop with terbutaline she was admitted for magnesium sulfate and she got 4 g loading and 2 g an hour on this stopped her contractions the magnesium was discontinued and on 1026 and she was started on Procardia 60 XL one daily since then she has not had any contractions or with a discharge today to see me in 2 weeks

## 2011-01-14 ENCOUNTER — Inpatient Hospital Stay (HOSPITAL_COMMUNITY)
Admission: AD | Admit: 2011-01-14 | Discharge: 2011-01-14 | Disposition: A | Discharge status: Home / Self Care | Payer: Medicaid Other | Source: Ambulatory Visit | Attending: Obstetrics | Admitting: Obstetrics

## 2011-01-14 ENCOUNTER — Encounter (HOSPITAL_COMMUNITY): Payer: Self-pay | Admitting: *Deleted

## 2011-01-14 DIAGNOSIS — R110 Nausea: Secondary | ICD-10-CM

## 2011-01-14 DIAGNOSIS — O479 False labor, unspecified: Secondary | ICD-10-CM

## 2011-01-14 DIAGNOSIS — O4700 False labor before 37 completed weeks of gestation, unspecified trimester: Secondary | ICD-10-CM

## 2011-01-14 DIAGNOSIS — O34219 Maternal care for unspecified type scar from previous cesarean delivery: Secondary | ICD-10-CM

## 2011-01-14 HISTORY — DX: Obesity, unspecified: E66.9

## 2011-01-14 LAB — URINALYSIS, ROUTINE W REFLEX MICROSCOPIC
Glucose, UA: NEGATIVE mg/dL
Hgb urine dipstick: NEGATIVE
Leukocytes, UA: NEGATIVE
Protein, ur: NEGATIVE mg/dL
pH: 8 (ref 5.0–8.0)

## 2011-01-14 LAB — WET PREP, GENITAL
Clue Cells Wet Prep HPF POC: NONE SEEN
Trich, Wet Prep: NONE SEEN
Yeast Wet Prep HPF POC: NONE SEEN

## 2011-01-14 MED ORDER — ONDANSETRON 8 MG PO TBDP
8.0000 mg | ORAL_TABLET | Freq: Once | ORAL | Status: AC
  Administered 2011-01-14: 8 mg via ORAL
  Filled 2011-01-14: qty 1

## 2011-01-14 MED ORDER — TERBUTALINE SULFATE 1 MG/ML IJ SOLN
0.2500 mg | Freq: Once | INTRAMUSCULAR | Status: AC
  Administered 2011-01-14: 0.25 mg via SUBCUTANEOUS
  Filled 2011-01-14: qty 1

## 2011-01-14 NOTE — Progress Notes (Signed)
Pt reports contractions since last pm off/on. Spotting earlier. reprts good fetal movement. G3P2, C/S x 2

## 2011-01-14 NOTE — ED Provider Notes (Signed)
History   Karen [name] is a 30 y.o. year old [phone] female at [redacted]w[redacted]d weeks gestation who presents to MAU reporting moderate to strong contractions every 5 minutes. She reports faint pink spotting positive fetal movement and denies leaking of fluid.   CSN: [id number] Arrival date & time: 01/14/2011  7:24 PM   None    Chief Complaint  Patient presents with  . Contractions    (Consider location/radiation/quality/duration/timing/severity/associated sxs/prior treatment) HPI  Past Medical History  Diagnosis Date  . No pertinent past medical history   . Obese     Past Surgical History  Procedure Date  . Cesarean section     No family history on file.  History  Substance Use Topics  . Smoking status: Never Smoker   . Smokeless tobacco: Never Used  . Alcohol Use: No    OB History    Grav Para Term Preterm Abortions TAB SAB Ect Mult Living   4 2 1 1 1  0 1 0 0 2      Review of Systems: otherwise negative  Allergies  Review of patient's allergies indicates no known allergies.  Home Medications  No current outpatient prescriptions on file.  BP 101/59  Pulse 107  Temp(Src) 98.4 F (36.9 C) (Oral)  Resp 18  Ht 5\' 6"  (1.676 m)  Wt 117.028 kg (258 lb)  BMI 41.64 kg/m2  SpO2 98%  Physical Exam  Constitutional: She is oriented to person, place, and time. She appears well-developed and well-nourished. She appears distressed (mildly).  Cardiovascular: Tachycardia present.   Pulmonary/Chest: Effort normal.  Abdominal: Soft. There is no tenderness.  Genitourinary: Vagina normal. Uterus is not tender. No bleeding around the vagina. No vaginal discharge found.  Musculoskeletal: Normal range of motion.  Neurological: She is alert and oriented to person, place, and time.  Skin: Skin is warm and dry.  Psychiatric: She has a normal mood and affect.   EFM: 140s category 1 Toco: Mild to moderate contractions every 3-6 minutes  Results for orders placed during the  hospital encounter of 01/14/11 (from the past 24 hour(s))  URINALYSIS, ROUTINE W REFLEX MICROSCOPIC     Status: Normal   Collection Time   01/14/11  7:40 PM      Component Value Range   Color, Urine YELLOW  YELLOW    Appearance CLEAR  CLEAR    Specific Gravity, Urine 1.015  1.005 - 1.030    pH 8.0  5.0 - 8.0    Glucose, UA NEGATIVE  NEGATIVE (mg/dL)   Hgb urine dipstick NEGATIVE  NEGATIVE    Bilirubin Urine NEGATIVE  NEGATIVE    Ketones, ur NEGATIVE  NEGATIVE (mg/dL)   Protein, ur NEGATIVE  NEGATIVE (mg/dL)   Urobilinogen, UA 0.2  0.0 - 1.0 (mg/dL)   Nitrite NEGATIVE  NEGATIVE    Leukocytes, UA NEGATIVE  NEGATIVE   WET PREP, GENITAL     Status: Abnormal   Collection Time   01/14/11  8:15 PM      Component Value Range   Yeast, Wet Prep NONE SEEN  NONE SEEN    Trich, Wet Prep NONE SEEN  NONE SEEN    Clue Cells, Wet Prep NONE SEEN  NONE SEEN    WBC, Wet Prep HPF POC FEW (*) NONE SEEN    ED Course  Procedures (including critical care time)   MDM  Terbutaline per consult w/ Dr. Estanislado Pandy UC' s rare after Terbutaline   Assessment: 1. Preterm UC's w/ no cervical change, resolved  w/ Terbutaline 2. FHR category I  3. Hx C/S x 2   Plan: 1. D/C home per Dr. Estanislado Pandy  2. PTL precautions 3. F/U w/ Dr. Gaynell Face as scheduled 01/20/11 or PRN  Katrinka Blazing, Reni Hausner 01/14/2011 10:20 PM

## 2011-01-14 NOTE — ED Notes (Signed)
Hx of preterm labor and delivery with one of her previous pregnancy; pt has been taken out of work and is on bedrest at home; pt is not on any tocolytics; has not eaten since 1330 today; cramping started last night around 2230;

## 2011-01-21 LAB — RPR: RPR: NONREACTIVE

## 2011-01-24 ENCOUNTER — Inpatient Hospital Stay (HOSPITAL_COMMUNITY)
Admission: AD | Admit: 2011-01-24 | Discharge: 2011-01-24 | Disposition: A | Discharge status: Home / Self Care | Payer: Medicaid Other | Source: Ambulatory Visit | Attending: Obstetrics | Admitting: Obstetrics

## 2011-01-24 ENCOUNTER — Encounter (HOSPITAL_COMMUNITY): Payer: Self-pay | Admitting: *Deleted

## 2011-01-24 DIAGNOSIS — O4700 False labor before 37 completed weeks of gestation, unspecified trimester: Secondary | ICD-10-CM | POA: Insufficient documentation

## 2011-01-24 DIAGNOSIS — O26859 Spotting complicating pregnancy, unspecified trimester: Secondary | ICD-10-CM | POA: Insufficient documentation

## 2011-01-24 DIAGNOSIS — O4690 Antepartum hemorrhage, unspecified, unspecified trimester: Secondary | ICD-10-CM

## 2011-01-24 LAB — URINALYSIS, ROUTINE W REFLEX MICROSCOPIC
Ketones, ur: NEGATIVE mg/dL
Leukocytes, UA: NEGATIVE
Nitrite: NEGATIVE
pH: 6.5 (ref 5.0–8.0)

## 2011-01-24 NOTE — Progress Notes (Signed)
Onset of spotting and abdominal pain this morning, spotting was around 1:00 this morning that has slowed.

## 2011-01-24 NOTE — ED Provider Notes (Signed)
History     Chief Complaint  Patient presents with  . Contractions  . Vaginal Bleeding   HPI This is a 30 y.o. at [redacted]w[redacted]d who presents with c/o contractions and spotting which occurred at 0100am and has not occurred since then. States had decreased FM earlier which resolved. Baby is now very active.  OB History    Grav Para Term Preterm Abortions TAB SAB Ect Mult Living   4 2 1 1 1  0 1 0 0 2      Past Medical History  Diagnosis Date  . No pertinent past medical history   . Obese     Past Surgical History  Procedure Date  . Cesarean section     History reviewed. No pertinent family history.  History  Substance Use Topics  . Smoking status: Never Smoker   . Smokeless tobacco: Never Used  . Alcohol Use: No    Allergies: No Known Allergies  Prescriptions prior to admission  Medication Sig Dispense Refill  . glycerin adult (GLYCERIN ADULT) 2 G SUPP Place 1 suppository rectally once. For constipation       . prenatal vitamin w/FE, FA (PRENATAL 1 + 1) 27-1 MG TABS Take 1 tablet by mouth daily.          ROS Denies leaking or current bleeding. + FM now.   Physical Exam   Blood pressure 112/61, pulse 112, temperature 98.5 F (36.9 C), temperature source Oral, resp. rate 16, height 5\' 6"  (1.676 m), weight 259 lb 6.4 oz (117.663 kg).  Physical Exam  Constitutional: She is oriented to person, place, and time. She appears well-developed and well-nourished. No distress.  HENT:  Head: Normocephalic.  Cardiovascular: Normal rate.   Respiratory: Effort normal.  GI: Soft. She exhibits no distension. There is no tenderness.  Genitourinary: Vagina normal and uterus normal. No vaginal discharge found.       No blood seen at all. Discharge is clear mucous. Cervix is closed/50%/-3/vtx/soft.  FHR reactive. No contractions  Musculoskeletal: Normal range of motion.  Neurological: She is alert and oriented to person, place, and time.  Skin: Skin is warm and dry.  Psychiatric: She  has a normal mood and affect.    MAU Course  Procedures  Assessment and Plan  A:  IUP at [redacted]w[redacted]d      Previous episode of spotting, now resolved.     Rare contractions     Reassuring fetal status P:  D/C home      Kick counts       Return if bleeding or labor resumes. Return if decrease in fetal movement.      Follow up in office as scheduled [facility] 01/24/2011, 3:28 PM

## 2011-01-31 ENCOUNTER — Inpatient Hospital Stay (HOSPITAL_COMMUNITY)
Admission: AD | Admit: 2011-01-31 | Discharge: 2011-02-01 | Disposition: A | Discharge status: Home / Self Care | Payer: Medicaid Other | Source: Ambulatory Visit | Attending: Obstetrics | Admitting: Obstetrics

## 2011-01-31 ENCOUNTER — Inpatient Hospital Stay (HOSPITAL_COMMUNITY): Payer: Medicaid Other

## 2011-01-31 ENCOUNTER — Encounter (HOSPITAL_COMMUNITY): Payer: Self-pay

## 2011-01-31 DIAGNOSIS — O368390 Maternal care for abnormalities of the fetal heart rate or rhythm, unspecified trimester, not applicable or unspecified: Secondary | ICD-10-CM

## 2011-01-31 DIAGNOSIS — O34219 Maternal care for unspecified type scar from previous cesarean delivery: Secondary | ICD-10-CM

## 2011-01-31 DIAGNOSIS — O479 False labor, unspecified: Secondary | ICD-10-CM

## 2011-01-31 DIAGNOSIS — O4700 False labor before 37 completed weeks of gestation, unspecified trimester: Secondary | ICD-10-CM | POA: Insufficient documentation

## 2011-01-31 DIAGNOSIS — IMO0002 Reserved for concepts with insufficient information to code with codable children: Secondary | ICD-10-CM

## 2011-01-31 LAB — URINALYSIS, ROUTINE W REFLEX MICROSCOPIC
Bilirubin Urine: NEGATIVE
Ketones, ur: NEGATIVE mg/dL
Nitrite: NEGATIVE
Specific Gravity, Urine: 1.01 (ref 1.005–1.030)
Urobilinogen, UA: 0.2 mg/dL (ref 0.0–1.0)

## 2011-01-31 LAB — URINE MICROSCOPIC-ADD ON

## 2011-01-31 NOTE — ED Provider Notes (Signed)
Karen [name] is a 30 y.o. year old [id number] female at 109w3d weeks gestation who presents to MAU reporting Labor. She reports normal FM and denies vaginal bleeding or leaking of fluid. She has a Hx of C/S and plans repeat.   History OB History    Grav Para Term Preterm Abortions TAB SAB Ect Mult Living   4 2 1 1 1  0 1 0 0 2     Past Medical History  Diagnosis Date  . No pertinent past medical history   . Obese    Past Surgical History  Procedure Date  . Cesarean section    Family History: family history is not on file. Social History:  reports that she has never smoked. She has never used smokeless tobacco. She reports that she does not drink alcohol or use illicit drugs.  ROS  Dilation: Fingertip Effacement (%): Thick Station:  (high) Exam by:: Peace, rn Blood pressure 127/57, pulse 113, temperature 98.5 F (36.9 C), temperature source Oral, resp. rate 20, SpO2 99.00%. Exam Physical Exam  Prenatal labs: ABO, Rh:   Antibody:   Rubella:   RPR:    HBsAg:    HIV:    GBS:     EFM: 150, initially mod variability, pos accels, two 1.5-2 minutes variable decels to 70-90's. Resolved after position change. 140's min variability, 10x10 accels, no decels Toco: regular mild-mod UC's  BPP: 6/8. 2 off for breathing mvmts AFI: 14.5 cm  0010: Per consult w/ Dr. Tamela Oddi monitor 2 hours. D/C home if category I tracing and return for NST tomorrow.  0210: FHR category I   Assessment/Plan: Assessment: 1. 36.4 weeks IUP 2. False labor 3. Decels, resolved. Fetal status over-all reassuring  Plan: 1. D/C home per consult w/ Dr. Tamela Oddi 2. F/U tomorrow for NST in MAU or PRN 3. FKCs   Rachard Isidro 01/31/2011, 11:23 PM

## 2011-01-31 NOTE — Progress Notes (Signed)
Patient is here with c/o contractions q71mins for an hour. She denies any vaginal bleeding, lof or discharge. She states that she has a planned repeated c-section. She reports good fetal movement.

## 2011-02-01 ENCOUNTER — Encounter (HOSPITAL_COMMUNITY)
Admission: AD | Disposition: A | Discharge status: Home / Self Care | Payer: Self-pay | Source: Ambulatory Visit | Attending: Obstetrics

## 2011-02-01 ENCOUNTER — Encounter (HOSPITAL_COMMUNITY): Payer: Self-pay | Admitting: Obstetrics and Gynecology

## 2011-02-01 ENCOUNTER — Inpatient Hospital Stay (HOSPITAL_COMMUNITY): Payer: Medicaid Other | Admitting: Anesthesiology

## 2011-02-01 ENCOUNTER — Encounter (HOSPITAL_COMMUNITY): Payer: Self-pay | Admitting: Anesthesiology

## 2011-02-01 ENCOUNTER — Inpatient Hospital Stay (HOSPITAL_COMMUNITY)
Admission: AD | Admit: 2011-02-01 | Discharge: 2011-02-04 | DRG: 765 | Disposition: A | Discharge status: Home / Self Care | Payer: Medicaid Other | Source: Ambulatory Visit | Attending: Obstetrics | Admitting: Obstetrics

## 2011-02-01 ENCOUNTER — Encounter (HOSPITAL_COMMUNITY): Payer: Self-pay | Admitting: *Deleted

## 2011-02-01 ENCOUNTER — Other Ambulatory Visit: Payer: Self-pay | Admitting: Obstetrics & Gynecology

## 2011-02-01 DIAGNOSIS — O34219 Maternal care for unspecified type scar from previous cesarean delivery: Principal | ICD-10-CM | POA: Diagnosis present

## 2011-02-01 DIAGNOSIS — O4700 False labor before 37 completed weeks of gestation, unspecified trimester: Secondary | ICD-10-CM

## 2011-02-01 LAB — CBC
Hemoglobin: 8.7 g/dL — ABNORMAL LOW (ref 12.0–15.0)
MCH: 21.8 pg — ABNORMAL LOW (ref 26.0–34.0)
MCV: 70.9 fL — ABNORMAL LOW (ref 78.0–100.0)
RBC: 3.99 MIL/uL (ref 3.87–5.11)

## 2011-02-01 SURGERY — Surgical Case
Anesthesia: Regional | Site: Abdomen | Wound class: Clean Contaminated

## 2011-02-01 MED ORDER — SENNOSIDES-DOCUSATE SODIUM 8.6-50 MG PO TABS
2.0000 | ORAL_TABLET | Freq: Every day | ORAL | Status: DC
  Administered 2011-02-02 – 2011-02-04 (×2): 2 via ORAL

## 2011-02-01 MED ORDER — TETANUS-DIPHTH-ACELL PERTUSSIS 5-2.5-18.5 LF-MCG/0.5 IM SUSP
0.5000 mL | Freq: Once | INTRAMUSCULAR | Status: AC
  Administered 2011-02-02: 0.5 mL via INTRAMUSCULAR
  Filled 2011-02-01 (×2): qty 0.5

## 2011-02-01 MED ORDER — FENTANYL CITRATE 0.05 MG/ML IJ SOLN
INTRAMUSCULAR | Status: DC | PRN
  Administered 2011-02-01: 75 ug via INTRAVENOUS

## 2011-02-01 MED ORDER — NALBUPHINE HCL 10 MG/ML IJ SOLN
5.0000 mg | INTRAMUSCULAR | Status: DC | PRN
  Filled 2011-02-01: qty 1

## 2011-02-01 MED ORDER — CITRIC ACID-SODIUM CITRATE 334-500 MG/5ML PO SOLN
ORAL | Status: AC
  Administered 2011-02-01: 30 mL via ORAL
  Filled 2011-02-01: qty 15

## 2011-02-01 MED ORDER — SODIUM CHLORIDE 0.9 % IJ SOLN: 3.0000 mL | INTRAMUSCULAR | Status: DC | PRN

## 2011-02-01 MED ORDER — IBUPROFEN 600 MG PO TABS
600.0000 mg | ORAL_TABLET | Freq: Four times a day (QID) | ORAL | Status: DC
  Administered 2011-02-02 – 2011-02-04 (×9): 600 mg via ORAL
  Filled 2011-02-01 (×5): qty 1

## 2011-02-01 MED ORDER — LACTATED RINGERS IV SOLN: INTRAVENOUS | Status: DC

## 2011-02-01 MED ORDER — FENTANYL CITRATE 0.05 MG/ML IJ SOLN
INTRAMUSCULAR | Status: AC
  Filled 2011-02-01: qty 2

## 2011-02-01 MED ORDER — MEPERIDINE HCL 25 MG/ML IJ SOLN: 6.2500 mg | INTRAMUSCULAR | Status: DC | PRN

## 2011-02-01 MED ORDER — METOCLOPRAMIDE HCL 5 MG/ML IJ SOLN: 10.0000 mg | Freq: Once | INTRAMUSCULAR | Status: DC | PRN

## 2011-02-01 MED ORDER — KETOROLAC TROMETHAMINE 30 MG/ML IJ SOLN: 30.0000 mg | Freq: Four times a day (QID) | INTRAMUSCULAR | Status: AC | PRN

## 2011-02-01 MED ORDER — DIPHENHYDRAMINE HCL 50 MG/ML IJ SOLN: 25.0000 mg | INTRAMUSCULAR | Status: DC | PRN

## 2011-02-01 MED ORDER — LANOLIN HYDROUS EX OINT: 1.0000 "application " | TOPICAL_OINTMENT | CUTANEOUS | Status: DC | PRN

## 2011-02-01 MED ORDER — SCOPOLAMINE 1 MG/3DAYS TD PT72
1.0000 | MEDICATED_PATCH | Freq: Once | TRANSDERMAL | Status: DC
  Administered 2011-02-01: 1.5 mg via TRANSDERMAL

## 2011-02-01 MED ORDER — DIPHENHYDRAMINE HCL 50 MG/ML IJ SOLN: 12.5000 mg | INTRAMUSCULAR | Status: DC | PRN

## 2011-02-01 MED ORDER — ONDANSETRON HCL 4 MG PO TABS: 4.0000 mg | ORAL_TABLET | ORAL | Status: DC | PRN

## 2011-02-01 MED ORDER — EPHEDRINE 5 MG/ML INJ
INTRAVENOUS | Status: AC
  Filled 2011-02-01: qty 10

## 2011-02-01 MED ORDER — DIPHENHYDRAMINE HCL 25 MG PO CAPS
25.0000 mg | ORAL_CAPSULE | Freq: Four times a day (QID) | ORAL | Status: DC | PRN
  Filled 2011-02-01: qty 1

## 2011-02-01 MED ORDER — KETOROLAC TROMETHAMINE 60 MG/2ML IM SOLN
60.0000 mg | Freq: Once | INTRAMUSCULAR | Status: AC | PRN
  Administered 2011-02-01: 60 mg via INTRAMUSCULAR

## 2011-02-01 MED ORDER — CITRIC ACID-SODIUM CITRATE 334-500 MG/5ML PO SOLN
30.0000 mL | Freq: Once | ORAL | Status: AC
  Administered 2011-02-01: 30 mL via ORAL

## 2011-02-01 MED ORDER — PHENYLEPHRINE 40 MCG/ML (10ML) SYRINGE FOR IV PUSH (FOR BLOOD PRESSURE SUPPORT)
PREFILLED_SYRINGE | INTRAVENOUS | Status: AC
  Filled 2011-02-01: qty 10

## 2011-02-01 MED ORDER — SODIUM CHLORIDE 0.9 % IV SOLN: 0.1000 mg/kg | Freq: Once | INTRAVENOUS | Status: DC | PRN

## 2011-02-01 MED ORDER — DEXTROSE IN LACTATED RINGERS 5 % IV SOLN
INTRAVENOUS | Status: DC
  Administered 2011-02-01: 16:00:00 via INTRAVENOUS

## 2011-02-01 MED ORDER — FERROUS SULFATE 325 (65 FE) MG PO TABS
325.0000 mg | ORAL_TABLET | Freq: Two times a day (BID) | ORAL | Status: DC
  Administered 2011-02-02 – 2011-02-04 (×5): 325 mg via ORAL
  Filled 2011-02-01 (×6): qty 1

## 2011-02-01 MED ORDER — DIBUCAINE 1 % RE OINT
1.0000 "application " | TOPICAL_OINTMENT | RECTAL | Status: DC | PRN
  Filled 2011-02-01: qty 28

## 2011-02-01 MED ORDER — SODIUM CHLORIDE 0.9 % IV SOLN
1.0000 ug/kg/h | INTRAVENOUS | Status: DC | PRN
  Filled 2011-02-01: qty 2.5

## 2011-02-01 MED ORDER — NALOXONE HCL 0.4 MG/ML IJ SOLN: 0.4000 mg | INTRAMUSCULAR | Status: DC | PRN

## 2011-02-01 MED ORDER — ONDANSETRON HCL 4 MG/2ML IJ SOLN
INTRAMUSCULAR | Status: DC | PRN
  Administered 2011-02-01: 4 mg via INTRAVENOUS

## 2011-02-01 MED ORDER — MAGNESIUM HYDROXIDE 400 MG/5ML PO SUSP
30.0000 mL | ORAL | Status: DC | PRN
  Administered 2011-02-04: 30 mL via ORAL
  Filled 2011-02-01 (×2): qty 30

## 2011-02-01 MED ORDER — ONDANSETRON HCL 4 MG/2ML IJ SOLN: 4.0000 mg | Freq: Once | INTRAMUSCULAR | Status: AC | PRN

## 2011-02-01 MED ORDER — DIPHENHYDRAMINE HCL 25 MG PO CAPS
25.0000 mg | ORAL_CAPSULE | ORAL | Status: DC | PRN
  Filled 2011-02-01: qty 1

## 2011-02-01 MED ORDER — SCOPOLAMINE 1 MG/3DAYS TD PT72
MEDICATED_PATCH | TRANSDERMAL | Status: AC
  Administered 2011-02-01: 1.5 mg via TRANSDERMAL
  Filled 2011-02-01: qty 1

## 2011-02-01 MED ORDER — FENTANYL CITRATE 0.05 MG/ML IJ SOLN: 25.0000 ug | INTRAMUSCULAR | Status: DC | PRN

## 2011-02-01 MED ORDER — LACTATED RINGERS IV SOLN
INTRAVENOUS | Status: DC | PRN
  Administered 2011-02-01 (×3): via INTRAVENOUS

## 2011-02-01 MED ORDER — CEFAZOLIN SODIUM 1-5 GM-% IV SOLN
INTRAVENOUS | Status: AC
  Filled 2011-02-01: qty 50

## 2011-02-01 MED ORDER — ONDANSETRON HCL 4 MG/2ML IJ SOLN
INTRAMUSCULAR | Status: AC
  Filled 2011-02-01: qty 2

## 2011-02-01 MED ORDER — ZOLPIDEM TARTRATE 5 MG PO TABS: 5.0000 mg | ORAL_TABLET | Freq: Every evening | ORAL | Status: DC | PRN

## 2011-02-01 MED ORDER — OXYTOCIN 10 UNIT/ML IJ SOLN
20.0000 [IU] | INTRAVENOUS | Status: DC | PRN
  Administered 2011-02-01: 20 [IU] via INTRAVENOUS

## 2011-02-01 MED ORDER — OXYTOCIN 20 UNITS IN LACTATED RINGERS INFUSION - SIMPLE: 125.0000 mL/h | INTRAVENOUS | Status: AC

## 2011-02-01 MED ORDER — MEASLES, MUMPS & RUBELLA VAC ~~LOC~~ INJ
0.5000 mL | INJECTION | Freq: Once | SUBCUTANEOUS | Status: DC
  Filled 2011-02-01: qty 0.5

## 2011-02-01 MED ORDER — CEFAZOLIN SODIUM-DEXTROSE 2-3 GM-% IV SOLR
2.0000 g | Freq: Once | INTRAVENOUS | Status: AC
  Administered 2011-02-01: 2 g via INTRAVENOUS
  Filled 2011-02-01: qty 50

## 2011-02-01 MED ORDER — PHENYLEPHRINE HCL 10 MG/ML IJ SOLN
INTRAMUSCULAR | Status: DC | PRN
  Administered 2011-02-01: 120 ug via INTRAVENOUS
  Administered 2011-02-01: 80 ug via INTRAVENOUS
  Administered 2011-02-01: 120 ug via INTRAVENOUS
  Administered 2011-02-01 (×2): 80 ug via INTRAVENOUS

## 2011-02-01 MED ORDER — KETOROLAC TROMETHAMINE 60 MG/2ML IM SOLN
INTRAMUSCULAR | Status: AC
  Administered 2011-02-01: 60 mg via INTRAMUSCULAR
  Filled 2011-02-01: qty 2

## 2011-02-01 MED ORDER — OXYCODONE-ACETAMINOPHEN 5-325 MG PO TABS
1.0000 | ORAL_TABLET | ORAL | Status: DC | PRN
  Administered 2011-02-02: 2 via ORAL
  Administered 2011-02-02 (×2): 1 via ORAL
  Administered 2011-02-03 – 2011-02-04 (×5): 2 via ORAL
  Filled 2011-02-01 (×3): qty 2
  Filled 2011-02-01: qty 1
  Filled 2011-02-01: qty 2
  Filled 2011-02-01: qty 1
  Filled 2011-02-01 (×4): qty 2

## 2011-02-01 MED ORDER — PHENYLEPHRINE 40 MCG/ML (10ML) SYRINGE FOR IV PUSH (FOR BLOOD PRESSURE SUPPORT)
PREFILLED_SYRINGE | INTRAVENOUS | Status: AC
  Filled 2011-02-01: qty 5

## 2011-02-01 MED ORDER — PRENATAL PLUS 27-1 MG PO TABS
1.0000 | ORAL_TABLET | Freq: Every day | ORAL | Status: DC
  Administered 2011-02-02 – 2011-02-04 (×3): 1 via ORAL
  Filled 2011-02-01 (×5): qty 1

## 2011-02-01 MED ORDER — IBUPROFEN 600 MG PO TABS
600.0000 mg | ORAL_TABLET | Freq: Four times a day (QID) | ORAL | Status: DC | PRN
  Filled 2011-02-01 (×4): qty 1

## 2011-02-01 MED ORDER — WITCH HAZEL-GLYCERIN EX PADS
1.0000 "application " | MEDICATED_PAD | CUTANEOUS | Status: DC | PRN
  Administered 2011-02-04: 1 via TOPICAL

## 2011-02-01 MED ORDER — OXYTOCIN 10 UNIT/ML IJ SOLN
INTRAMUSCULAR | Status: AC
  Filled 2011-02-01: qty 4

## 2011-02-01 MED ORDER — MORPHINE SULFATE (PF) 0.5 MG/ML IJ SOLN
INTRAMUSCULAR | Status: DC | PRN
  Administered 2011-02-01: .15 mg via INTRATHECAL

## 2011-02-01 MED ORDER — MORPHINE SULFATE 0.5 MG/ML IJ SOLN
INTRAMUSCULAR | Status: AC
  Filled 2011-02-01: qty 10

## 2011-02-01 MED ORDER — ONDANSETRON HCL 4 MG/2ML IJ SOLN
4.0000 mg | INTRAMUSCULAR | Status: DC | PRN
  Administered 2011-02-01: 4 mg via INTRAVENOUS
  Filled 2011-02-01: qty 2

## 2011-02-01 MED ORDER — SODIUM CHLORIDE 0.9 % IR SOLN
Status: DC | PRN
  Administered 2011-02-01: 1000 mL

## 2011-02-01 MED ORDER — MEDROXYPROGESTERONE ACETATE 150 MG/ML IM SUSP: 150.0000 mg | INTRAMUSCULAR | Status: DC | PRN

## 2011-02-01 MED ORDER — SIMETHICONE 80 MG PO CHEW
80.0000 mg | CHEWABLE_TABLET | ORAL | Status: DC | PRN
  Administered 2011-02-02 – 2011-02-04 (×8): 80 mg via ORAL

## 2011-02-01 MED ORDER — EPHEDRINE SULFATE 50 MG/ML IJ SOLN
INTRAMUSCULAR | Status: DC | PRN
  Administered 2011-02-01 (×2): 5 mg via INTRAVENOUS
  Administered 2011-02-01 (×4): 10 mg via INTRAVENOUS

## 2011-02-01 MED ORDER — BUPIVACAINE IN DEXTROSE 0.75-8.25 % IT SOLN
INTRATHECAL | Status: DC | PRN
  Administered 2011-02-01: 14 mL via INTRATHECAL

## 2011-02-01 MED ORDER — FENTANYL CITRATE 0.05 MG/ML IJ SOLN
INTRAMUSCULAR | Status: DC | PRN
  Administered 2011-02-01: 25 ug via INTRATHECAL

## 2011-02-01 SURGICAL SUPPLY — 42 items
BENZOIN TINCTURE PRP APPL 2/3 (GAUZE/BANDAGES/DRESSINGS) ×2 IMPLANT
CANISTER WOUND CARE 500ML ATS (WOUND CARE) IMPLANT
CHLORAPREP W/TINT 26ML (MISCELLANEOUS) ×2 IMPLANT
CLOTH BEACON ORANGE TIMEOUT ST (SAFETY) ×2 IMPLANT
CONTAINER PREFILL 10% NBF 15ML (MISCELLANEOUS) IMPLANT
DRESSING TELFA 8X3 (GAUZE/BANDAGES/DRESSINGS) ×2 IMPLANT
DRSG COVADERM 4X10 (GAUZE/BANDAGES/DRESSINGS) ×2 IMPLANT
DRSG VAC ATS LRG SENSATRAC (GAUZE/BANDAGES/DRESSINGS) IMPLANT
DRSG VAC ATS MED SENSATRAC (GAUZE/BANDAGES/DRESSINGS) IMPLANT
DRSG VAC ATS SM SENSATRAC (GAUZE/BANDAGES/DRESSINGS) IMPLANT
ELECT REM PT RETURN 9FT ADLT (ELECTROSURGICAL) ×2
ELECTRODE REM PT RTRN 9FT ADLT (ELECTROSURGICAL) ×1 IMPLANT
EXTRACTOR VACUUM M CUP 4 TUBE (SUCTIONS) IMPLANT
GAUZE SPONGE 4X4 12PLY STRL LF (GAUZE/BANDAGES/DRESSINGS) IMPLANT
GLOVE BIO SURGEON STRL SZ 6.5 (GLOVE) ×4 IMPLANT
GOWN PREVENTION PLUS LG XLONG (DISPOSABLE) ×4 IMPLANT
KIT ABG SYR 3ML LUER SLIP (SYRINGE) IMPLANT
NEEDLE HYPO 25X5/8 SAFETYGLIDE (NEEDLE) IMPLANT
NS IRRIG 1000ML POUR BTL (IV SOLUTION) ×2 IMPLANT
PACK C SECTION WH (CUSTOM PROCEDURE TRAY) ×2 IMPLANT
PAD ABD 7.5X8 STRL (GAUZE/BANDAGES/DRESSINGS) ×2 IMPLANT
RTRCTR C-SECT PINK 25CM LRG (MISCELLANEOUS) IMPLANT
RTRCTR C-SECT PINK 34CM XLRG (MISCELLANEOUS) IMPLANT
SLEEVE SCD COMPRESS KNEE MED (MISCELLANEOUS) ×2 IMPLANT
STAPLER VISISTAT 35W (STAPLE) IMPLANT
STRIP CLOSURE SKIN 1/2X4 (GAUZE/BANDAGES/DRESSINGS) ×2 IMPLANT
SUT MNCRL 0 VIOLET CTX 36 (SUTURE) ×3 IMPLANT
SUT MNCRL AB 3-0 PS2 27 (SUTURE) ×2 IMPLANT
SUT MONOCRYL 0 CTX 36 (SUTURE) ×3
SUT PDS AB 0 CTX 36 PDP370T (SUTURE) ×4 IMPLANT
SUT PLAIN 0 NONE (SUTURE) IMPLANT
SUT VIC AB 0 CT1 27 (SUTURE)
SUT VIC AB 0 CT1 27XBRD ANBCTR (SUTURE) IMPLANT
SUT VIC AB 2-0 CT1 (SUTURE) IMPLANT
SUT VIC AB 2-0 CT1 27 (SUTURE) ×1
SUT VIC AB 2-0 CT1 TAPERPNT 27 (SUTURE) ×1 IMPLANT
SUT VIC AB 2-0 SH 27 (SUTURE)
SUT VIC AB 2-0 SH 27XBRD (SUTURE) IMPLANT
TAPE CLOTH SURG 4X10 WHT LF (GAUZE/BANDAGES/DRESSINGS) ×2 IMPLANT
TOWEL OR 17X24 6PK STRL BLUE (TOWEL DISPOSABLE) ×4 IMPLANT
TRAY FOLEY CATH 14FR (SET/KITS/TRAYS/PACK) IMPLANT
WATER STERILE IRR 1000ML POUR (IV SOLUTION) ×2 IMPLANT

## 2011-02-01 NOTE — Anesthesia Preprocedure Evaluation (Addendum)
Anesthesia Evaluation   Patient awake    Reviewed: Allergy & Precautions, NPO status , Patient's Chart, lab work & pertinent test results  Airway Mallampati: III TM Distance: >3 FB Neck ROM: full  Mouth opening: Limited Mouth Opening  Dental No notable dental hx.    Pulmonary neg pulmonary ROS,    Pulmonary exam normal       Cardiovascular neg cardio ROS     Neuro/Psych Negative Neurological ROS  Negative Psych ROS   GI/Hepatic negative GI ROS, Neg liver ROS,   Endo/Other  Negative Endocrine ROSMorbid obesity  Renal/GU negative Renal ROS  Genitourinary negative   Musculoskeletal   Abdominal   Peds  Hematology negative hematology ROS (+)   Anesthesia Other Findings   Reproductive/Obstetrics (+) Pregnancy                           Anesthesia Physical Anesthesia Plan  ASA: III and Emergent  Anesthesia Plan: Spinal   Post-op Pain Management:    Induction:   Airway Management Planned:   Additional Equipment:   Intra-op Plan:   Post-operative Plan:   Informed Consent: I have reviewed the patients History and Physical, chart, labs and discussed the procedure including the risks, benefits and alternatives for the proposed anesthesia with the patient or authorized representative who has indicated his/her understanding and acceptance.   Dental Advisory Given  Plan Discussed with: Anesthesiologist, CRNA and Surgeon  Anesthesia Plan Comments:         Anesthesia Quick Evaluation

## 2011-02-01 NOTE — Transfer of Care (Signed)
Immediate Anesthesia Transfer of Care Note  Patient: Karen [name]  Procedure(s) Performed:  CESAREAN SECTION - Repeat cesarean section with delivery of baby boy at 59. Apgars 9/9.  Patient Location: PACU  Anesthesia Type: Spinal  Level of Consciousness: awake, alert  and oriented  Airway & Oxygen Therapy: Patient Spontanous Breathing  Post-op Assessment: Report given to PACU RN and Post -op Vital signs reviewed and stable  Post vital signs: stable  Complications: No apparent anesthesia complications

## 2011-02-01 NOTE — Op Note (Signed)
Cesarean Section Procedure Note   Karen [name]   02/01/2011  Indications: Early labor, h/o 2 prior C/D   Pre-operative Diagnosis: previous cesarean section x2/in labor.   Post-operative Diagnosis: Same   Surgeon: Antionette Char A  Assistants: none  Anesthesia: spinal  Procedure Details:  The patient was seen in the Holding Room. The risks, benefits, complications, treatment options, and expected outcomes were discussed with the patient. The patient concurred with the proposed plan, giving informed consent. The patient was identified as Karen [name] and the procedure verified as C-Section Delivery. A Time Out was held and the above information confirmed.  After induction of anesthesia, the patient was draped and prepped in the usual sterile manner. A transverse incision was made and carried down through the subcutaneous tissue to the fascia. The fascial incision was made and extended transversely. The fascia was separated from the underlying rectus tissue superiorly and inferiorly. The peritoneum was identified and entered. The peritoneal incision was extended longitudinally. The utero-vesical peritoneal reflection was incised transversely and the bladder flap was bluntly freed from the lower uterine segment. A low transverse uterine incision was made. Delivered from cephalic presentation was a  living newborn female infant with Apgar scores of 9 at one minute and 9 at five minutes. A cord ph was not sent. The umbilical cord was clamped and cut cord. A sample was obtained for evaluation. The placenta was removed Intact and appeared normal.  The uterine incision was closed with running locked sutures of 1-0 Monocryl. A second imbricating layer of the same suture was placed.  Hemostasis was observed. The paracolic gutters were irrigated. The fascia was then reapproximated with running sutures of 1-0Vicryl. The subcuticular closure was performed using 3-0 Monocryl.  Instrument, sponge,  and needle counts were correct prior the abdominal closure and were correct at the conclusion of the case.    Findings:  See above   Estimated Blood Loss: 600 ml  Total IV Fluids: 2300 ml   Urine Output: 800CC OF clear urine  Specimens: Placenta  Complications: no complications  Disposition: PACU - hemodynamically stable.  Maternal Condition: stable   Baby condition / location:  nursery-stable    Signed: Surgeon(s): Roseanna Rainbow, MD

## 2011-02-01 NOTE — Anesthesia Procedure Notes (Signed)
Spinal  Patient location during procedure: OR Start time: 02/01/2011 5:04 PM Staffing Anesthesiologist: Lucero Ide A. Performed by: anesthesiologist  Preanesthetic Checklist Completed: patient identified, site marked, surgical consent, pre-op evaluation, timeout performed, IV checked, risks and benefits discussed and monitors and equipment checked Spinal Block Patient position: sitting Prep: site prepped and draped and DuraPrep Patient monitoring: heart rate, cardiac monitor, continuous pulse ox and blood pressure Approach: midline Location: L3-4 Injection technique: single-shot Needle Needle type: Sprotte and Pencan  Needle gauge: 24 G Needle length: 9 cm Needle insertion depth: 8 cm Assessment Sensory level: T4 Events: paresthesia Additional Notes Transient paresthesia left leg resolved. Patient tolerated procedure well. Adequate sensory level.

## 2011-02-01 NOTE — H&P (Signed)
Karen [name] is a 30 y.o. female presenting for contractions. Maternal Medical History:  Reason for admission: Reason for admission: contractions.  Contractions: Onset was yesterday.   Frequency: regular.   Perceived severity is moderate.    Fetal activity: Perceived fetal activity is decreased.    Prenatal complications: no prenatal complications   OB History    Grav Para Term Preterm Abortions TAB SAB Ect Mult Living   4 2 1 1 1  0 1 0 0 2     Past Medical History  Diagnosis Date  . No pertinent past medical history   . Obese    Past Surgical History  Procedure Date  . Cesarean section    Family History: family history is not on file. Social History:  reports that she has never smoked. She has never used smokeless tobacco. She reports that she does not drink alcohol or use illicit drugs.  Review of Systems  Constitutional: Negative for fever.  Eyes: Negative for blurred vision.  Respiratory: Negative for shortness of breath.   Gastrointestinal: Negative for vomiting.  Skin: Negative for rash.  Neurological: Negative for headaches.    Dilation: 1 Effacement (%): 50 Station: -3 Exam by:: J. Rasch RN  Blood pressure 107/58, pulse 111, temperature 98 F (36.7 C), temperature source Oral, resp. rate 20. Maternal Exam:  Uterine Assessment: Contraction strength is moderate.  Contraction frequency is regular.   Abdomen: Patient reports no abdominal tenderness. Introitus: not evaluated.     Fetal Exam Fetal Monitor Review: Variability: moderate (6-25 bpm).   Pattern: accelerations present and no decelerations.    Fetal State Assessment: Category I - tracings are normal.     Physical Exam  Constitutional: She appears well-developed.  HENT:  Head: Normocephalic.  Neck: Neck supple. No thyromegaly present.  Cardiovascular: Normal rate and regular rhythm.   Respiratory: Breath sounds normal.  GI: Soft. Bowel sounds are normal.  Skin: No rash noted.      Prenatal labs: ABO, Rh:   Antibody:   Rubella:   RPR:    HBsAg:    HIV:    GBS:     Assessment/Plan: Multipara @ [redacted]w[redacted]d.  Early labor.  History previous cesarean deliveries x 2.  Not a candidate for a TOLAC.  Admit  Repeat cesarean delivery   JACKSON-MOORE,Lindsee Labarre A 02/01/2011, 4:29 PM

## 2011-02-01 NOTE — ED Provider Notes (Signed)
History     CSN: [id number] Arrival date & time: 02/01/2011  1:36 PM   None     Chief Complaint  Patient presents with  . Contractions  . Non-stress Test    (Consider location/radiation/quality/duration/timing/severity/associated sxs/prior treatment) HPI Karen [name]  Is a 30 y.o. [id number] [redacted]w[redacted]d. She returns for NST, continuing contractions and decreased fetal activity. She had a MAU visit yesterday for labor check, had decelerations, her BPP was 6/8, no sustained fetal breathing was seen. Today no leaking or bleeding, contractions seen to last too long. Past Medical History  Diagnosis Date  . No pertinent past medical history   . Obese     Past Surgical History  Procedure Date  . Cesarean section     No family history on file.  History  Substance Use Topics  . Smoking status: Never Smoker   . Smokeless tobacco: Never Used  . Alcohol Use: No    OB History    Grav Para Term Preterm Abortions TAB SAB Ect Mult Living   4 2 1 1 1  0 1 0 0 2      Review of Systems  Constitutional: Negative for fever and chills.  Genitourinary: Negative for vaginal bleeding.    Allergies  Review of patient's allergies indicates no known allergies.  Home Medications  No current outpatient prescriptions on file.  BP 110/64  Pulse 100  Temp(Src) 98.4 F (36.9 C) (Oral)  Resp 18  Physical Exam  Constitutional: She is oriented to person, place, and time. She appears well-developed and well-nourished.  Abdominal: Soft. She exhibits no distension. There is no tenderness. There is no rebound and no guarding.  Genitourinary:       Cx 1 cm, 50%, -3 per RN exam  Musculoskeletal: Normal range of motion.  Neurological: She is alert and oriented to person, place, and time.  Skin: Skin is warm and dry.  Psychiatric: She has a normal mood and affect. Her behavior is normal.    ED Course  Procedures (including critical care time)  Labs Reviewed - No data to display US Ob  Limited  02/01/2011  OBSTETRICAL ULTRASOUND: This exam was performed within a [facility]. The OB US report was generated in the AS system, and faxed to the ordering physician.   This report is also available in [employer] and in the [employer]. See AS Obstetric US report.   US Fetal Bpp W/o Non Stress  02/01/2011  OBSTETRICAL ULTRASOUND: This exam was performed within a [facility]. The OB US report was generated in the AS system, and faxed to the ordering physician.   This report is also available in [employer] and in the [employer]. See AS Obstetric US report.     No diagnosis found. ASSESSMENT:  Cervical change, pt is uncomfortable  PLAN:  Dr Tamela Oddi in to see pt To the OR for repeat C/S   MDM          Avon Gully. Karen [name] 02/01/11 1627

## 2011-02-01 NOTE — Progress Notes (Signed)
Pt presents to MAU with chief complaint of contractions and NST; pt was sent by Dr. Tawny Hopping on call nurse. Pt was seen in MAU last night for labor eval; baby had variable/decelerations, was told to come back to MAU today for NST. Pt also complains that contractions are stronger.

## 2011-02-01 NOTE — Anesthesia Postprocedure Evaluation (Signed)
  Anesthesia Post-op Note  Patient: Karen [name]  Procedure(s) Performed:  CESAREAN SECTION - Repeat cesarean section with delivery of baby boy at 76. Apgars 9/9.  Patient Location: PACU  Anesthesia Type: Spinal  Level of Consciousness: awake, alert  and oriented  Airway and Oxygen Therapy: Patient Spontanous Breathing  Post-op Pain: none  Post-op Assessment: Post-op Vital signs reviewed, Patient's Cardiovascular Status Stable, Respiratory Function Stable, Patent Airway, No signs of Nausea or vomiting, Adequate PO intake, Pain level controlled, No headache and No backache  Post-op Vital Signs: Reviewed and stable  Complications: No apparent anesthesia complications

## 2011-02-02 DIAGNOSIS — Z23 Encounter for immunization: Secondary | ICD-10-CM

## 2011-02-02 LAB — HEMOGLOBIN: Hemoglobin: 7.7 g/dL — ABNORMAL LOW (ref 12.0–15.0)

## 2011-02-02 MED ORDER — INFLUENZA VIRUS VACC SPLIT PF IM SUSP
0.5000 mL | INTRAMUSCULAR | Status: AC
  Administered 2011-02-02: 0.5 mL via INTRAMUSCULAR
  Filled 2011-02-02: qty 0.5

## 2011-02-02 NOTE — Anesthesia Postprocedure Evaluation (Signed)
  Anesthesia Post-op Note  Patient: Karen [name]  Procedure(s) Performed:  CESAREAN SECTION - Repeat cesarean section with delivery of baby boy at 62. Apgars 9/9.  Patient Location: Mother/Baby  Anesthesia Type: Spinal  Level of Consciousness: alert   Airway and Oxygen Therapy: Patient Spontanous Breathing  Post-op Pain: mild  Post-op Assessment: Patient's Cardiovascular Status Stable and Respiratory Function Stable  Post-op Vital Signs: stable  Complications: No apparent anesthesia complications

## 2011-02-02 NOTE — Progress Notes (Signed)
UR chart review completed.  

## 2011-02-02 NOTE — Addendum Note (Signed)
Addendum  created 02/02/11 1014 by Fanny Dance   Modules edited:Notes Section

## 2011-02-02 NOTE — Progress Notes (Signed)
Unsure of patient's Rubella status. Called office to get Rubella status and patient is immune. Lenon Oms RN

## 2011-02-02 NOTE — Progress Notes (Signed)
Patient ID: Karen [name], female   DOB: 09-Sep-1980, 30 y.o.   MRN: [id number] Postoperative day #1 Vital signs normal Fundus firm Incision clean and dry Lochia moderate Legs negative No complaints

## 2011-02-03 ENCOUNTER — Encounter (HOSPITAL_COMMUNITY): Payer: Self-pay | Admitting: Obstetrics & Gynecology

## 2011-02-03 NOTE — Progress Notes (Signed)
PSYCHOSOCIAL ASSESSMENT ~ MATERNAL/CHILD Name: Karen [name]                                                                                    Age: 30   Referral Date: 02/03/11 Reason/Source: NICU support  I. FAMILY/HOME ENVIRONMENT A. Child's Legal Guardian _x__Parent(s) ___Grandparent ___Foster parent ___DSS_________________ Name: Donata Duff                                  DOB: 11-29-1980              Age: 94   Address: [address], [place], [place] [address]  Name: Nelda Severe                              DOB: //                     Age:   Address: does not live with MOB  B. Other Household Members/Support Persons Name: Idalia Needle (7)            Relationship: sister              DOB ___/___/___                   Name: [name] IV            Relationship: brother            DOB ___/___/___                   Name:                                         Relationship:                        DOB ___/___/___                   Name:                                         Relationship:                        DOB ___/___/___  C. Other Support: MOB's grandmother is her greatest support person.  PGM is also supportive, but lives in [place], [place].   II. PSYCHOSOCIAL DATA A. Information Source  _x_Patient Interview  __Family Interview           __Other___________  B. Event organiser _x_Employment: [employer]  _x_Medicaid    [place]: [employer]                __Private Insurance:                   __Self Pay  _x_Food Stamps   _x_WIC __[employer] First     __Public Housing     __Section 8    __Maternity Care Coordination/Child Service Coordination/Early Intervention  __School:                                                                         Grade:  __Other:   Salena Saner Cultural and Environment Information Cultural Issues Impacting Care: none  known  III. STRENGTHS _x__Supportive family/friends _x__Adequate Resources _x__Compliance with medical plan ___Home prepared for Child (including basic supplies) _x__Understanding of illness      _x__Other: Pediatric follow up will be at [facility] IV. RISK FACTORS AND CURRENT PROBLEMS         __x__No Problems Noted                                                                                                                                                                                                                                       Pt              Family     Substance Abuse                                                                ___              ___        Mental Illness  ___              ___  Family/Relationship Issues                                      ___               ___             Abuse/Neglect/Domestic Violence                                         ___         ___  Financial Resources                                        ___              ___             Transportation                                                                        ___               ___  DSS Involvement                                                                   ___              ___  Adjustment to Illness                                                               ___              ___  Knowledge/Cognitive Deficit                                                   ___              ___             Compliance with Treatment                                                 ___                ___  Basic Needs (food, housing, etc.)                                          ___              ___             Housing Concerns                                       ___              ___ Other_____________________________________________________________            V. SOCIAL WORK ASSESSMENT SW met with MOB in her  first floor room to introduce myself, complete assessment and evaluate how she is coping with baby's admission to NICU.  MOB was extremely pleasant and very thankful for SW's visit.  She states that FOB is involved, but they are not together.  She has two other children at home, who have another father.  She states her grandmother is watching them while she is in the hospital and that her grandmother raised her and is her greatest support.  She states that PGM had a baby shower for her and they got a lot of clothes, but not much else.  She did not ask for anything, but SW offered to get her some diapers and wipes from [employer] and her face lit up.  She was so thankful.  SW will also get MOB a pack and play because she states she cannot afford a crib and currently she only has a mattress from a garage sale.  MOB was incredibly appreciative.  She seems to be coping very well with the NICU situation and has a great appreciation for the staff.  She told SW that her 30 year old was born prematurely and spent three months in the NICU.  SW explained support services offered by NICU SWs and gave contact information.  MOB states no other questions or needs at this time.  SW and [employer] delivered her items and she was again very thankful.  VI. SOCIAL WORK PLAN  ___No Further Intervention Required/No Barriers to Discharge   _x__Psychosocial Support and Ongoing Assessment of Needs   ___Patient/Family Education:   ___Child Protective Services Report   County___________ Date___/____/____   ___Information/Referral to [employer]_________________________   _x__Other: Family Support [facility]

## 2011-02-03 NOTE — Progress Notes (Signed)
Patient ID: Karen [name], female   DOB: [birth year], 30 y.o.   MRN: [id number] Postop day 2 Vital signs normal him Incision clean Lochia moderate Legs negative Doing well no complaints

## 2011-02-04 NOTE — Discharge Summary (Signed)
Obstetric Discharge Summary Reason for Admission: onset of labor Prenatal Procedures: none Intrapartum Procedures: cesarean: low cervical, transverse Postpartum Procedures: none Complications-Operative and Postpartum: none Hemoglobin  Date Value Range Status  02/02/2011 7.7* 12.0-15.0 (g/dL) Final     HCT  Date Value Range Status  02/01/2011 28.3* 36.0-46.0 (%) Final    Discharge Diagnoses: Term Pregnancy-delivered  Discharge Information: Date: 02/04/2011 Activity: pelvic rest Diet: routine Medications: Percocet Condition: stable Instructions: refer to practice specific booklet Discharge to: home Follow-up Information    Follow up with Nathanuel Cabreja A, MD. Call in 6 weeks.   Contact information:   [address] [place] [address] [phone]          Newborn Data: Live born female  Birth Weight: 5 lb 13.1 oz (2640 g) APGAR: 9, 9  Home with mother.  Maham Quintin A 02/04/2011, 7:10 AM

## 2012-10-06 IMAGING — US US OB COMP +14 WK
1 series · 12 of 28 positions shown · non-contrast
Comparison: none

[Series 1: us ob comp +14 wk · 42 acquisitions, 12 frames shown]
[im 2/42]
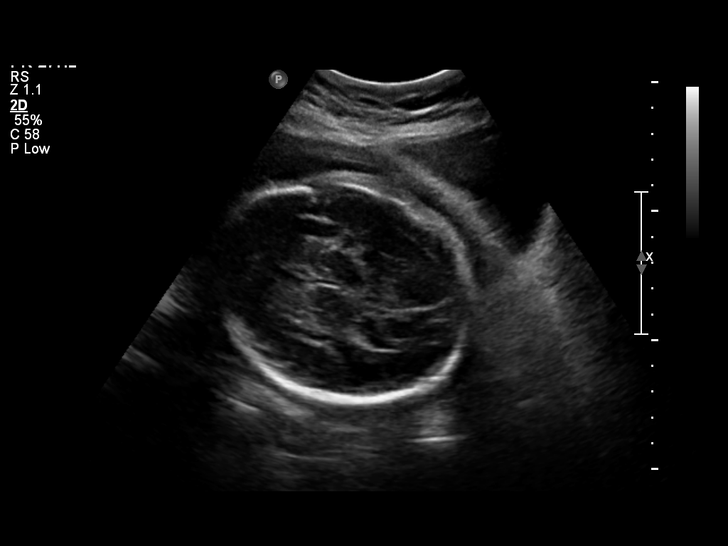
[im 5/42]
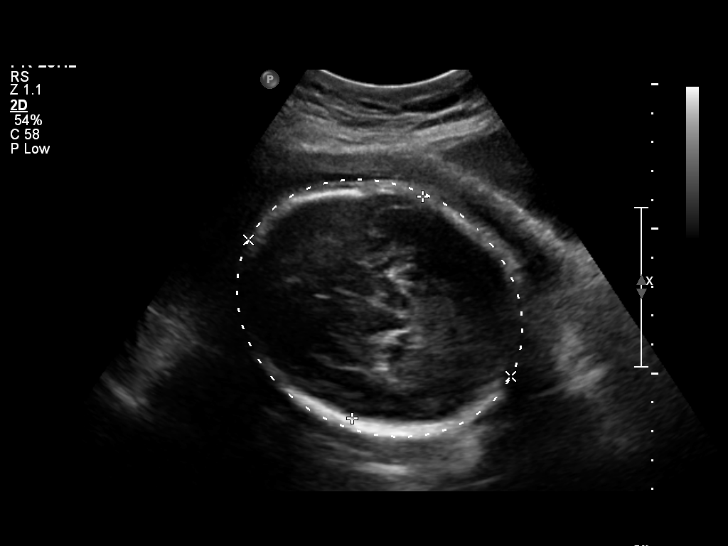
[im 8/42]
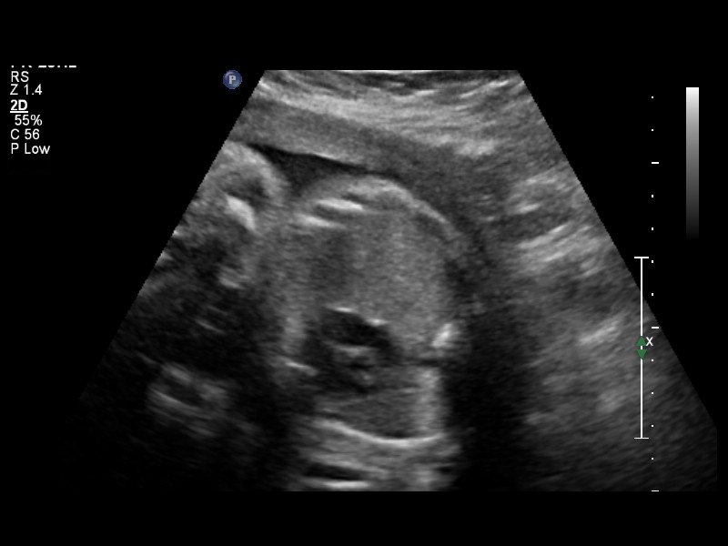
[im 13/42]
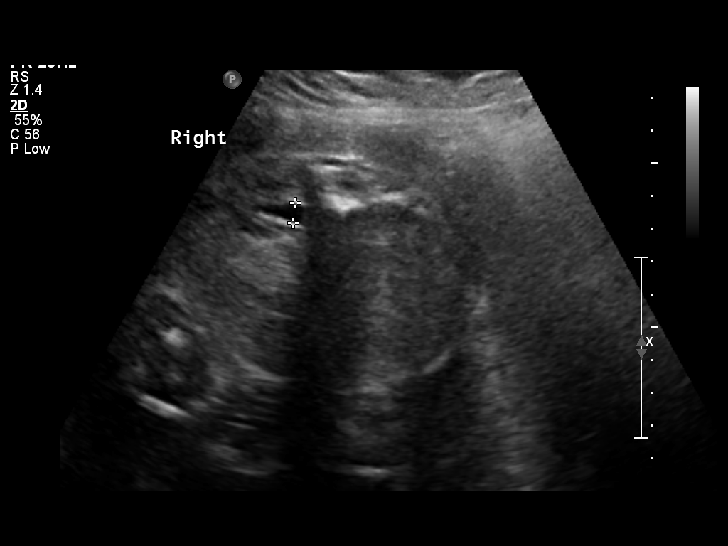
[im 16/42]
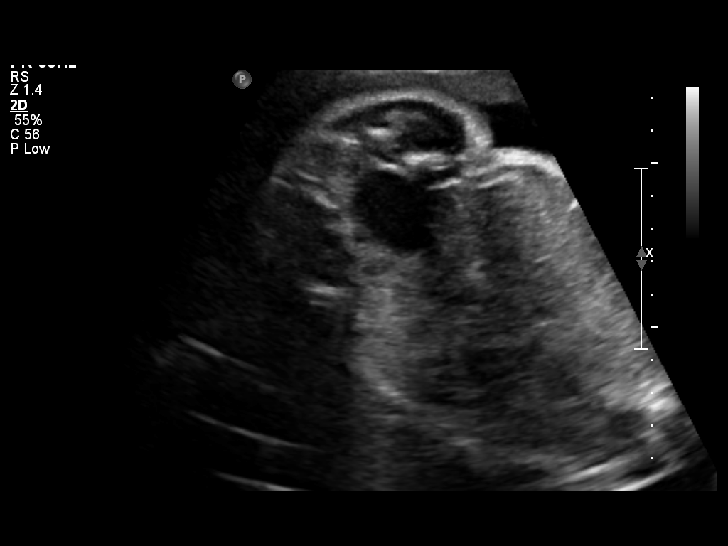
[im 19/42]
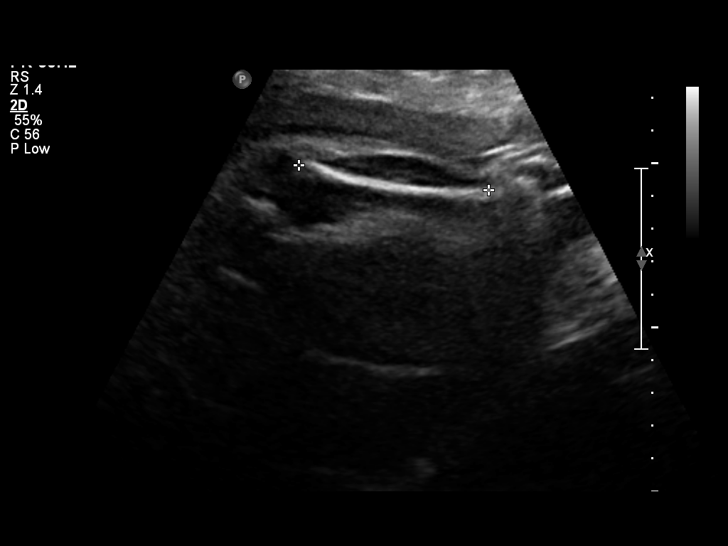
[im 23/42]
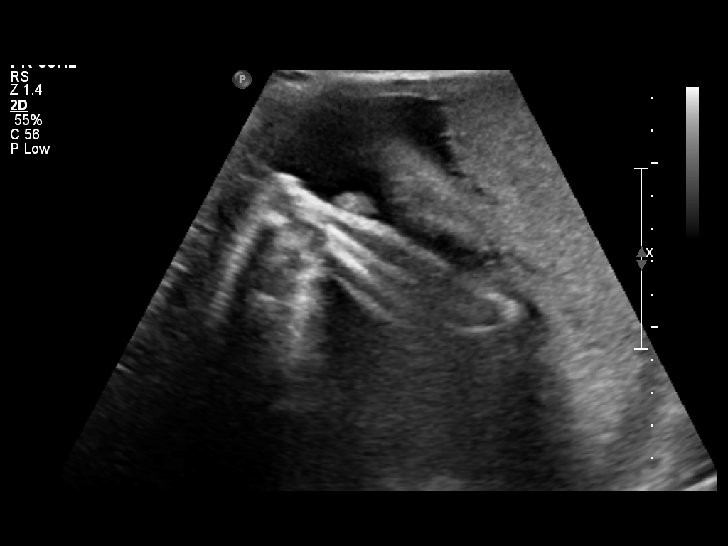
[im 26/42]
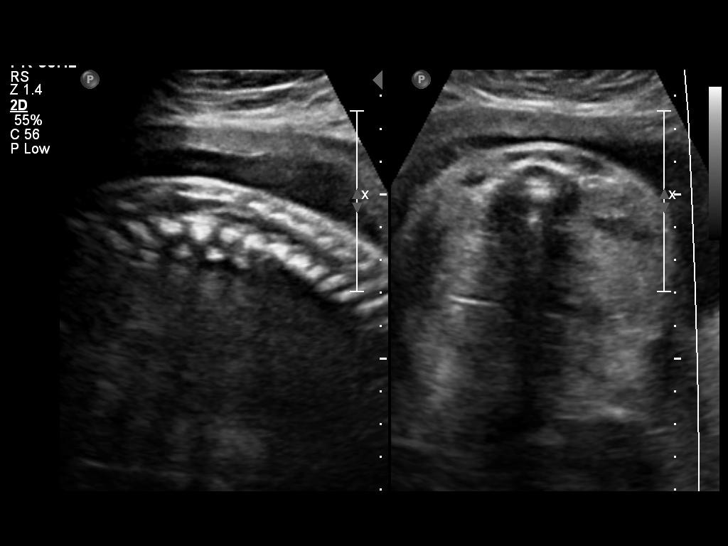
[im 29/42]
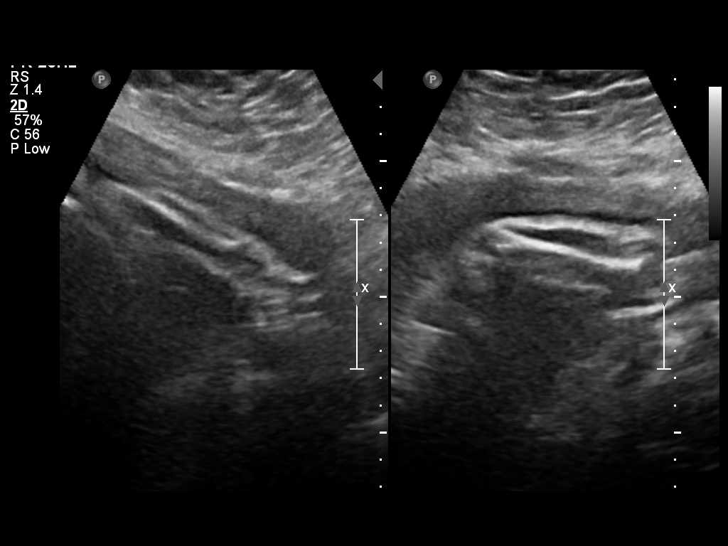
[im 34/42]
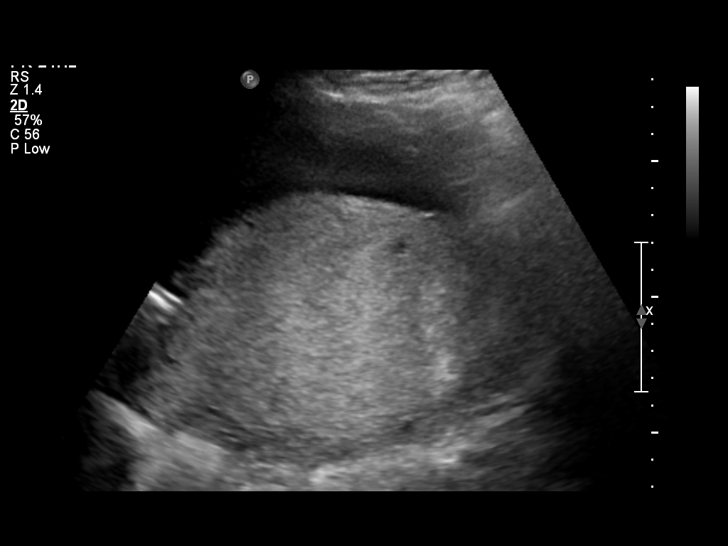
[im 37/42]
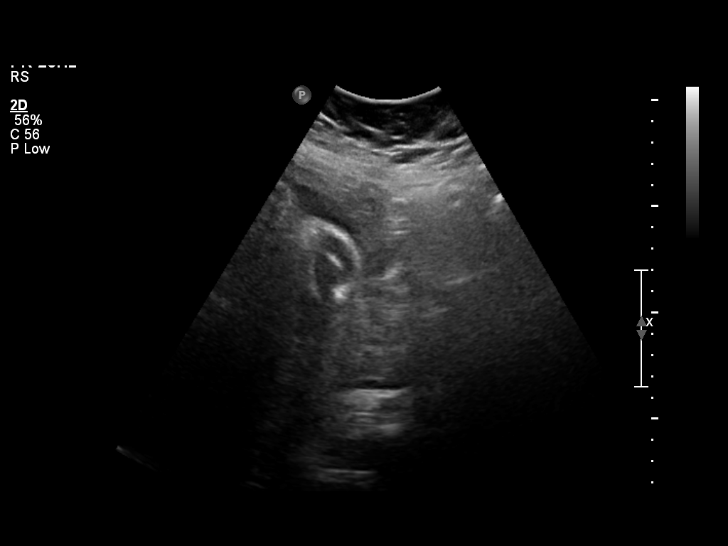
[im 40/42]
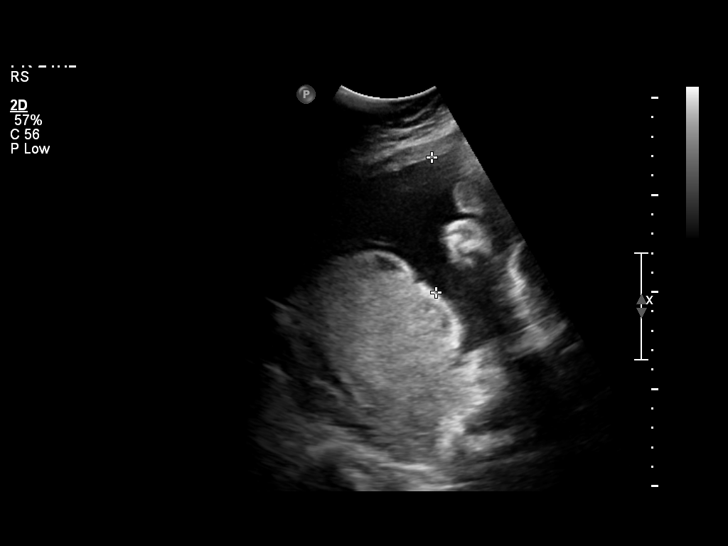

[12 of 28 positions shown; findings below may reference images not displayed]

OBSTETRICS REPORT
                      (Signed Final 12/26/2010 [DATE])

 Order#:         [id number]
Procedures

 US OB COMP + 14 WK                                    76805.1
Indications

 Preterm labor (> 22 weeks)
 Uncertain fetal presentation
 Assess Fetal Growth / Estimated Fetal Weight
Fetal Evaluation

 Fetal Heart Rate:  144                          bpm
 Cardiac Activity:  Observed
 Presentation:      Cephalic
 Placenta:          Posterior, above cervical
                    os
 P. Cord Insertion: Not well visualized

 Amniotic Fluid
 AFI FV:      Subjectively within normal limits
 AFI Sum:     20.75   cm       80  %Tile      Larg Pckt:   8.91  cm
 RUQ:   6.94    cm   RLQ:    2.67   cm    LUQ:   2.23    cm   LLQ:    8.91   cm
Biometry

 BPD:     80.2  mm     G. Age:  32w 1d                CI:        72.12   70 - 86
                                                      FL/HC:       20.0  19.3 -

 HC:     300.5  mm     G. Age:  33w 2d       70  %    HC/AC:       1.12  0.96 -

 AC:     267.7  mm     G. Age:  30w 6d       35  %    FL/BPD:      75.1  71 - 87
 FL:      60.2  mm     G. Age:  31w 2d       37  %    FL/AC:       22.5  20 - 24

 Est. FW:    3363  gm     3 lb 14 oz     57  %
Gestational Age

 Clinical EDD:  31w 2d                                        EDD:   02/25/11
 U/S Today:     31w 6d                                        EDD:   02/21/11
 Best:          31w 2d     Det. By:  Clinical EDD             EDD:   02/25/11
Anatomy

 Cranium:           Appears normal      Aortic Arch:       Basic anatomy
                                                           exam per order
 Fetal Cavum:       Not well            Ductal Arch:       Basic anatomy
                    visualized                             exam per order
 Ventricles:        Appears normal      Diaphragm:         Basic anatomy
                                                           exam per order
 Choroid Plexus:    Not well            Stomach:           Appears normal,
                    visualized                             left sided
 Cerebellum:        Not well            Abdomen:           Appears normal
                    visualized
 Posterior Fossa:   Not well            Abdominal Wall:    Not well
                    visualized                             visualized
 Nuchal Fold:       Not applicable      Cord Vessels:      Appears normal
                    (>20 wks GA)                           (3 vessel cord)
 Face:              Not well            Kidneys:           Appear normal
                    visualized
 Heart:             Not well            Bladder:           Appears normal
                    visualized
 RVOT:              Basic anatomy       Spine:             Not well
                    exam per order                         visualized
 LVOT:              Basic anatomy       Limbs:             Not well
                    exam per order                         visualized

 Other:     Technically difficult due to maternal habitus, advanced
            gestational age, and fetal position.
Cervix Uterus Adnexa

 Cervix:       Not evaluated.
 Uterus:       No abnormality visualized.

 Left Ovary:    Not visualized.
 Right Ovary:   Not visualized.

 Adnexa:     No abnormality visualized.
Comments

 This study was performed as an on call procedure and was
 initially reviewed by Dr. Museef Tareen on 12/26/2010.
Impression

 Single intrauterine gestation demonstrating an estimated
 gestational age by ultrasound of 31w 6d. This is correlated
 with expected estimated gestational age by clinical EDD of
 31w 2d. EFW is currently at the 57%.

 Overall anatomic evaluation is compromised by advanced
 gestational age combined with maternal habitus and fetal
 position. Visualized anatomy appears normal.

 Subjectively and quantitatively normal amniotic fluid volume.

 Cervix long and closed on exam by history, not evaluated on
 this exam.

 Cephalic presentation.

## 2012-11-11 IMAGING — US US FETAL BPP W/O NONSTRESS
1 series · 13 of 15 positions shown · non-contrast
Comparison: none

[Series 1: us fetal bpp w/o nonstress · non-contrast · 15 acquisitions, 13 frames shown]
[im 1/15]
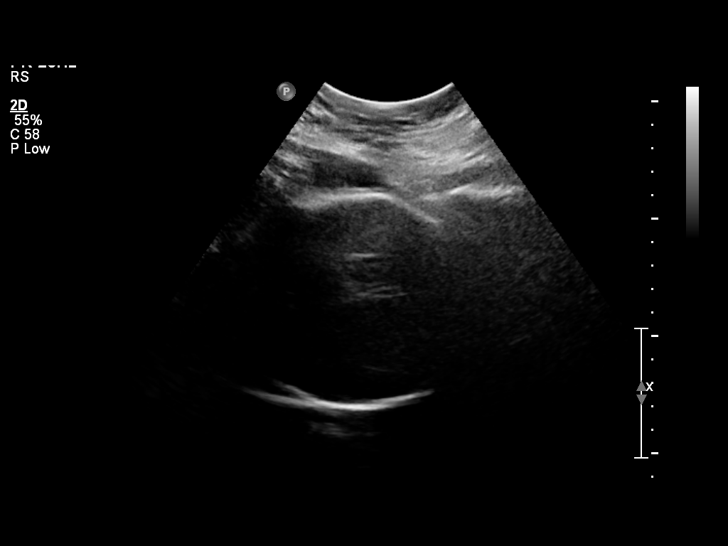
[im 2/15]
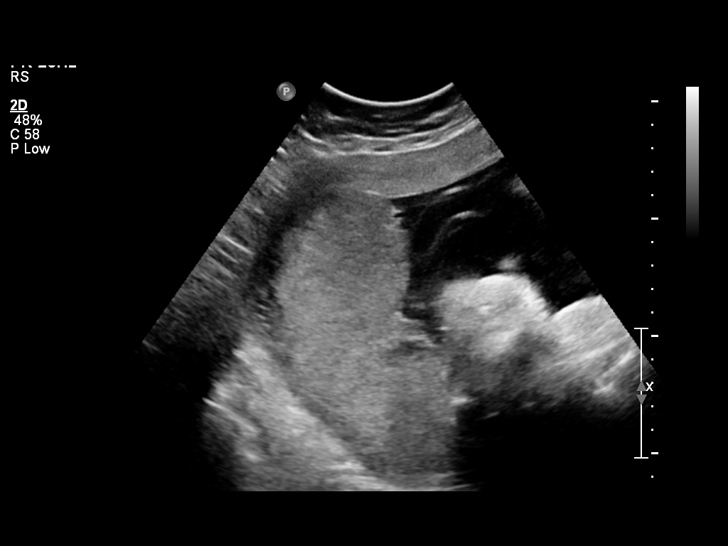
[im 3/15]
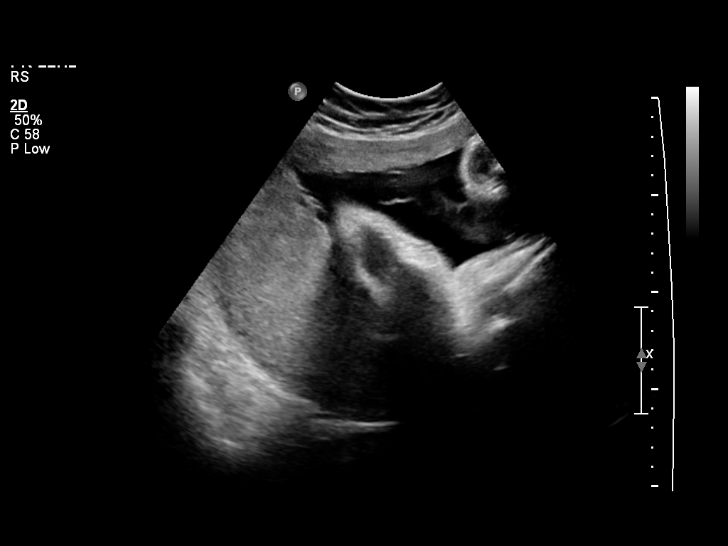
[im 5/15]
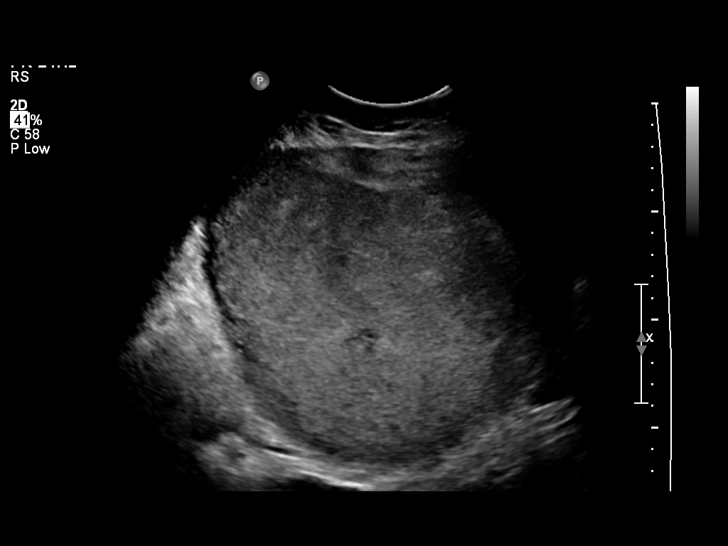
[im 6/15]
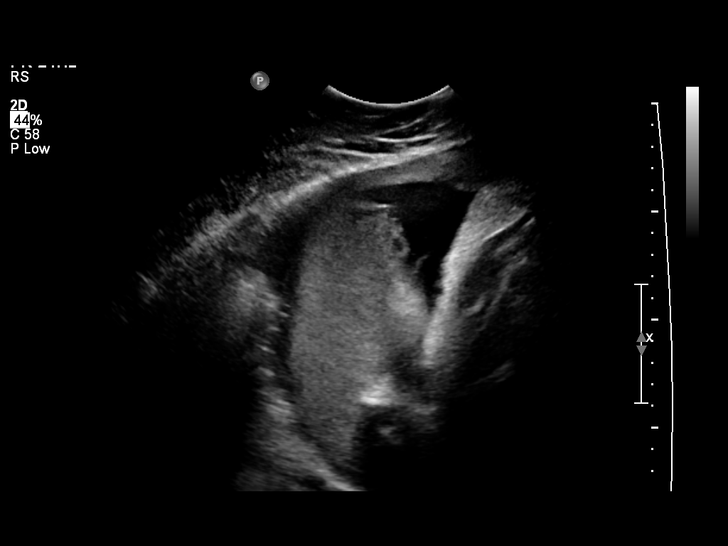
[im 7/15]
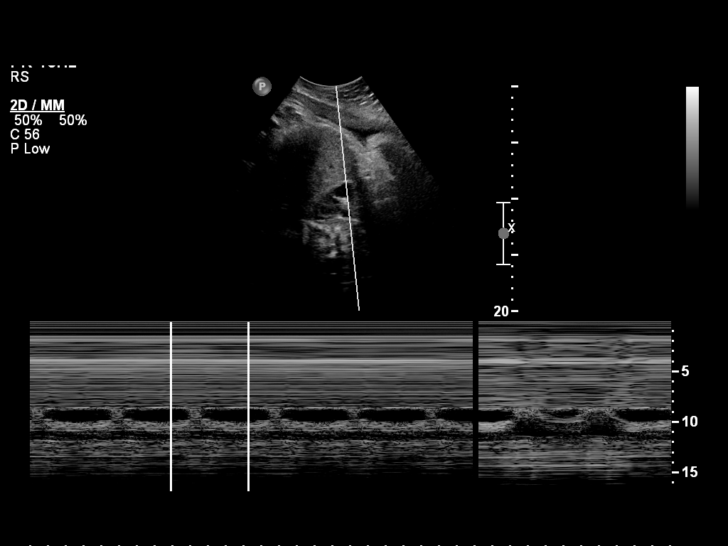
[im 8/15]
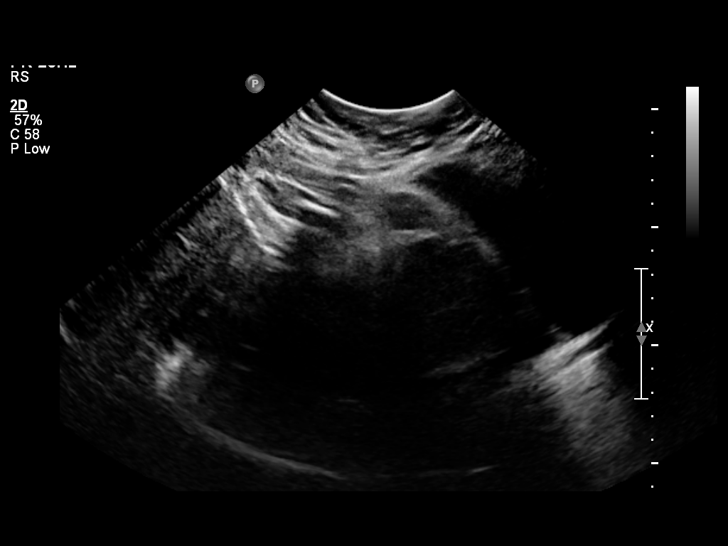
[im 9/15]
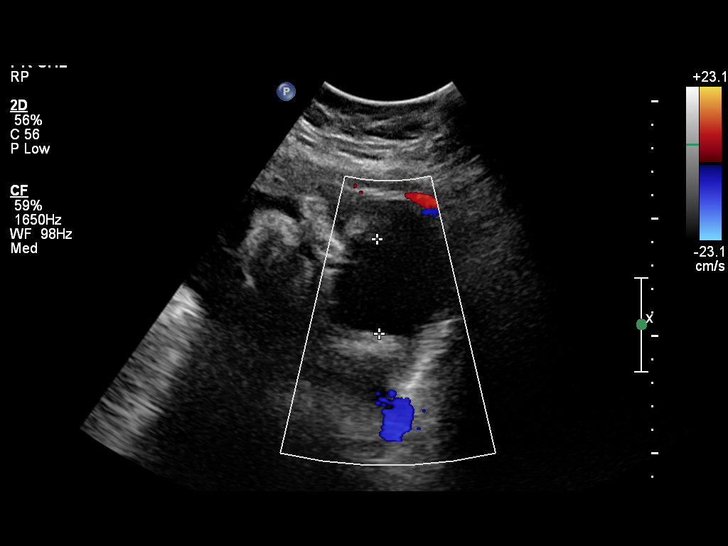
[im 10/15]
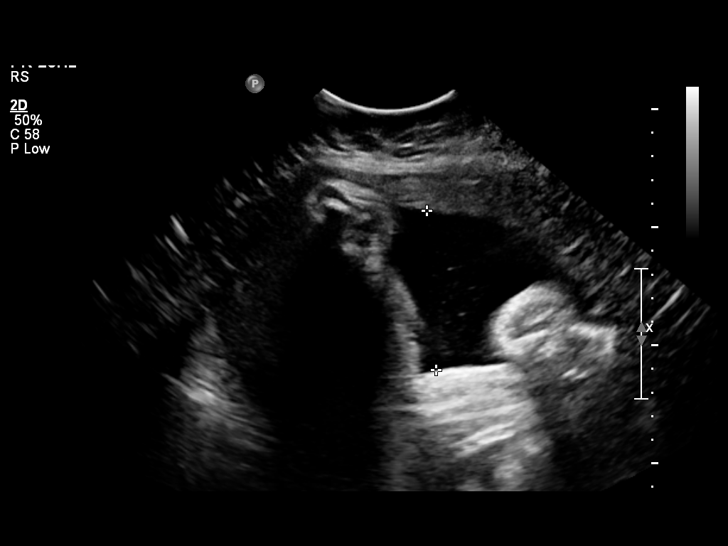
[im 11/15]
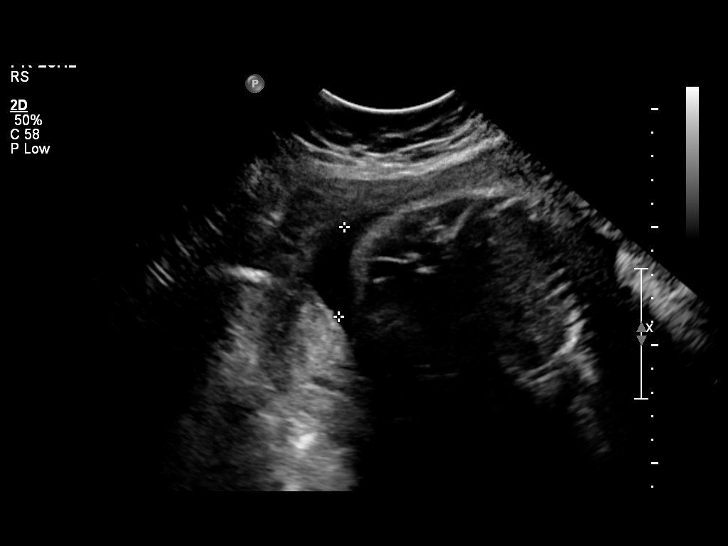
[im 13/15]
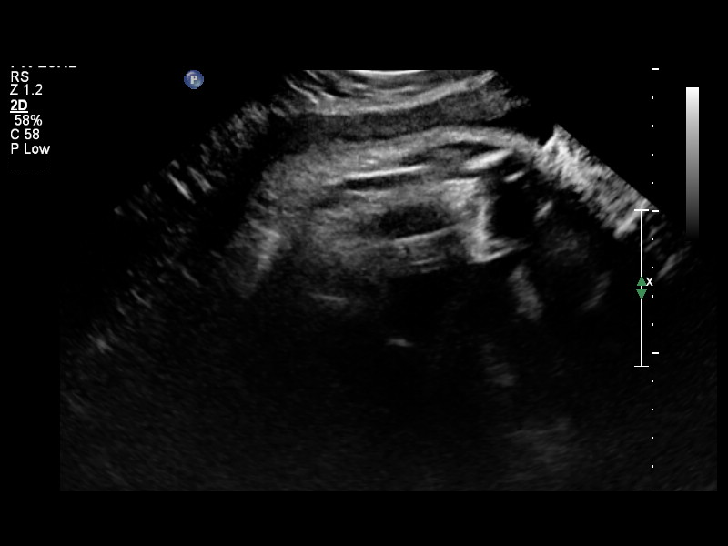
[im 14/15]
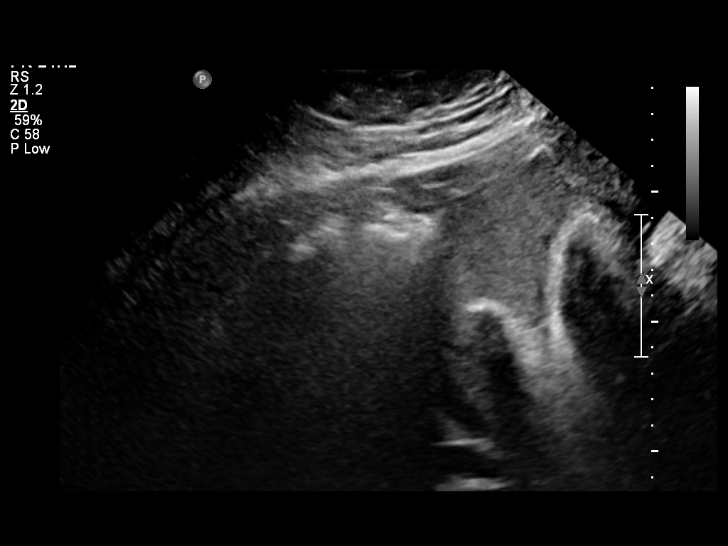
[im 15/15]
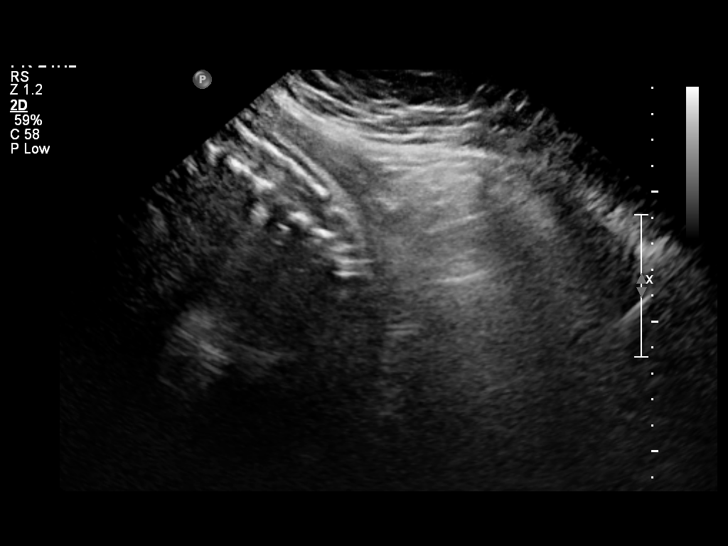

[13 of 15 positions shown; findings below may reference images not displayed]

OBSTETRICS REPORT
                      (Signed Final 02/01/2011 [DATE])

                 22_E
Procedures

 [HOSPITAL]                                         76815.0
Indications

 Assess fetal well being
 Non-reactive NST, FHR decelerations
 Assess amniotic fluid volume
Fetal Evaluation

 Fetal Heart Rate:  138                         bpm
 Cardiac Activity:  Observed
 Presentation:      Cephalic
 Placenta:          Posterior, above cervical
                    os
 P. Cord            Not well visualized
 Insertion:

 Comment:    Breathing noted intermittently, but not sustained.

 Amniotic Fluid
 AFI FV:      Subjectively within normal limits
 AFI Sum:     14.54   cm      54   %Tile     Larg Pckt:   6.73   cm
 RUQ:   6.73   cm    RLQ:    3.79   cm    LLQ:   4.02    cm
Biophysical Evaluation

 Amniotic F.V:   Pocket => 2 cm two         F. Tone:        Observed
                 planes
 F. Movement:    Observed                   Score:          [DATE]
 F. Breathing:   Not Observed
Gestational Age

 Clinical EDD:  36w 3d                                        EDD:   02/25/11
 Best:          36w 3d    Det. By:   Clinical EDD             EDD:   02/25/11
Cervix Uterus Adnexa

 Cervix:       Not visualized (advanced GA >34 wks)
 Adnexa:     No abnormality visualized.
Impression

 Single living IUP with assigned GA of 36w 3d in cephalic
 position.
 Normal amniotic fluid volume.
 BPP is [DATE]. No sustained fetal breathing was seen.

## 2014-01-01 ENCOUNTER — Encounter (HOSPITAL_COMMUNITY): Payer: Self-pay | Admitting: Obstetrics & Gynecology

## 2014-02-27 ENCOUNTER — Encounter: Payer: Self-pay | Admitting: Obstetrics & Gynecology

## 2015-02-16 ENCOUNTER — Emergency Department (HOSPITAL_COMMUNITY)
Admission: EM | Admit: 2015-02-16 | Discharge: 2015-02-16 | Disposition: A | Discharge status: Home / Self Care | Payer: Self-pay | Attending: Emergency Medicine | Admitting: Emergency Medicine

## 2015-02-16 ENCOUNTER — Encounter (HOSPITAL_COMMUNITY): Payer: Self-pay

## 2015-02-16 DIAGNOSIS — K5900 Constipation, unspecified: Secondary | ICD-10-CM | POA: Insufficient documentation

## 2015-02-16 DIAGNOSIS — E669 Obesity, unspecified: Secondary | ICD-10-CM | POA: Insufficient documentation

## 2015-02-16 DIAGNOSIS — K644 Residual hemorrhoidal skin tags: Secondary | ICD-10-CM | POA: Insufficient documentation

## 2015-02-16 DIAGNOSIS — K625 Hemorrhage of anus and rectum: Secondary | ICD-10-CM | POA: Insufficient documentation

## 2015-02-16 NOTE — ED Notes (Signed)
Patient took milk of magnesia before coming to hospital tonight.

## 2015-02-16 NOTE — Discharge Instructions (Signed)
Nebulized daily. See information below on constipation. Warm sitz baths. Follow-up with gastroenterologist or primary care doctor. Please return if severe abdominal pain, fever, worsening bleeding.  Constipation, Adult Constipation is when a person has fewer than three bowel movements a week, has difficulty having a bowel movement, or has stools that are dry, hard, or larger than normal. As people grow older, constipation is more common. A low-fiber diet, not taking in enough fluids, and taking certain medicines may make constipation worse.  CAUSES   Certain medicines, such as antidepressants, pain medicine, iron supplements, antacids, and water pills.   Certain diseases, such as diabetes, irritable bowel syndrome (IBS), thyroid disease, or depression.   Not drinking enough water.   Not eating enough fiber-rich foods.   Stress or travel.   Lack of physical activity or exercise.   Ignoring the urge to have a bowel movement.   Using laxatives too much.  SIGNS AND SYMPTOMS   Having fewer than three bowel movements a week.   Straining to have a bowel movement.   Having stools that are hard, dry, or larger than normal.   Feeling full or bloated.   Pain in the lower abdomen.   Not feeling relief after having a bowel movement.  DIAGNOSIS  Your health care provider will take a medical history and perform a physical exam. Further testing may be done for severe constipation. Some tests may include:  A barium enema X-ray to examine your rectum, colon, and, sometimes, your small intestine.   A sigmoidoscopy to examine your lower colon.   A colonoscopy to examine your entire colon. TREATMENT  Treatment will depend on the severity of your constipation and what is causing it. Some dietary treatments include drinking more fluids and eating more fiber-rich foods. Lifestyle treatments may include regular exercise. If these diet and lifestyle recommendations do not help, your  health care provider may recommend taking over-the-counter laxative medicines to help you have bowel movements. Prescription medicines may be prescribed if over-the-counter medicines do not work.  HOME CARE INSTRUCTIONS   Eat foods that have a lot of fiber, such as fruits, vegetables, whole grains, and beans.  Limit foods high in fat and processed sugars, such as french fries, hamburgers, cookies, candies, and soda.   A fiber supplement may be added to your diet if you cannot get enough fiber from foods.   Drink enough fluids to keep your urine clear or pale yellow.   Exercise regularly or as directed by your health care provider.   Go to the restroom when you have the urge to go. Do not hold it.   Only take over-the-counter or prescription medicines as directed by your health care provider. Do not take other medicines for constipation without talking to your health care provider first.  SEEK IMMEDIATE MEDICAL CARE IF:   You have bright red blood in your stool.   Your constipation lasts for more than 4 days or gets worse.   You have abdominal or rectal pain.   You have thin, pencil-like stools.   You have unexplained weight loss. MAKE SURE YOU:   Understand these instructions.  Will watch your condition.  Will get help right away if you are not doing well or get worse.   This information is not intended to replace advice given to you by your health care provider. Make sure you discuss any questions you have with your health care provider.   Document Released: 11/15/2003 Document Revised: 03/09/2014 Document Reviewed: 11/28/2012 Elsevier  Interactive Patient Education ©2016 [employer]. ° °

## 2015-02-16 NOTE — ED Notes (Signed)
Pt c/o bright red blood from rectum a few months ago w/ mucous r/t constipation.  States the same happened tonight minus the mucous.  Denies n/v/d, fevers or any other complaints.

## 2015-02-16 NOTE — ED Provider Notes (Signed)
CSN: [id number]     Arrival date & time 02/16/15  1803 History   First MD Initiated Contact with Patient 02/16/15 1814     Chief Complaint  Patient presents with  . Rectal Bleeding  . Constipation     (Consider location/radiation/quality/duration/timing/severity/associated sxs/prior Treatment) HPI Karen [name] is a 34 y.o. female with no medical problems, presents to ED with complaint of rectal bleeding. Patient states about a month ago she had an episode of bright red blood in her stool. She states it resolved. Today she was straining to have a bowel movement and reports that she wiped her rectum, without having a bowel movement, and noticed bright red blood with mucus on the tissue paper. She denies any abdominal pain. She denies any rectal pain. She reports chronic constipation. She is currently trying to eat high fiber diet and take their likes as needed. She has done enemas in the past. She denies any fever or chills. She states that her mother recently diagnosed with colon cancer and she became concerned after she saw blood. She also states she has quit with her symptoms and concern that she may have colitis. Otherwise healthy and does not take any medications.  Past Medical History  Diagnosis Date  . No pertinent past medical history   . Obese    Past Surgical History  Procedure Laterality Date  . Cesarean section    . Cesarean section  02/01/2011    Procedure: CESAREAN SECTION;  Surgeon: Roseanna RainbowLisa A Jackson-Moore, MD;  Location: [facility];  Service: Gynecology;  Laterality: N/A;  Repeat cesarean section with delivery of baby boy at [id number]. Apgars 9/9.   No family history on file. Social History  Substance Use Topics  . Smoking status: Never Smoker   . Smokeless tobacco: Never Used  . Alcohol Use: No   OB History    Gravida Para Term Preterm AB TAB SAB Ectopic Multiple Living   4 3 1 2 1  0 1 0 0 3     Review of Systems  Constitutional: Negative for fever and chills.   Respiratory: Negative for cough, chest tightness and shortness of breath.   Cardiovascular: Negative for chest pain, palpitations and leg swelling.  Gastrointestinal: Positive for constipation and blood in stool. Negative for nausea, vomiting, abdominal pain and diarrhea.  Genitourinary: Negative for dysuria and flank pain.  Musculoskeletal: Negative for myalgias, arthralgias, neck pain and neck stiffness.  Skin: Negative for rash.  Neurological: Negative for dizziness, weakness and headaches.  All other systems reviewed and are negative.     Allergies  Review of patient's allergies indicates no known allergies.  Home Medications   Prior to Admission medications   Not on File   BP 125/86 mmHg  Pulse 100  Temp(Src) 97.9 F (36.6 C) (Oral)  Resp 16  Ht 5\' 6"  (1.676 m)  Wt 88.905 kg  BMI 31.65 kg/m2  SpO2 100%  LMP 01/28/2015 Physical Exam  Constitutional: She appears well-developed and well-nourished. No distress.  HENT:  Head: Normocephalic.  Eyes: Conjunctivae are normal.  Neck: Neck supple.  Cardiovascular: Normal rate, regular rhythm and normal heart sounds.   Pulmonary/Chest: Effort normal and breath sounds normal. No respiratory distress. She has no wheezes. She has no rales.  Abdominal: Soft. Bowel sounds are normal. She exhibits no distension. There is no tenderness. There is no rebound.  Genitourinary:  Skin tag noted to the rectum, with no signs of inflammation, infection, bleeding. Internal hemorrhoid visualized protruding through the rectum, erythematous,  no active bleeding  Musculoskeletal: She exhibits no edema.  Neurological: She is alert.  Skin: Skin is warm and dry.  Psychiatric: She has a normal mood and affect. Her behavior is normal.  Nursing note and vitals reviewed.   ED Course  Procedures (including critical care time) Labs Review Labs Reviewed - No data to display  Imaging Review No results found. I have personally reviewed and evaluated  these images and lab results as part of my medical decision-making.   EKG Interpretation None      MDM   Final diagnoses:  Rectal bleeding    patient with episode of rectal bleeding, with just small blood on tissue with mucus. Similar event happened one month ago. Patient with ongoing constipation for multiple years. She denies any fever, chills, vomiting, abdominal pain. Abdomen is benign with no tenderness. Rectal exam shows no signs of infection, no active bleeding, possible internal hemorrhoid which could explain patient's bleeding. Patient expresses her concern, after researching her symptoms online. Patient's mother just recently diagnosed with colon cancer. She is also concerned about colitis. I explained to her, since no active abdominal pain, fever, bleeding, no active exacerbation of possible colitis, no signs of obstruction. Discussed at length symptoms and signs that should bring her back. At this time I do not think she needs any lab work or imaging. I explained to her she will need to follow-up with gastroenterology for better rectal exam, possible sigmoidoscopy or even colonoscopy. At this time will treat with sitz baths, Mira lax daily for constipation, follow-up. Strict return precautions discussed. Patient agrees with the plan.  Filed Vitals:   02/16/15 1817  BP: 125/86  Pulse: 100  Temp: 97.9 F (36.6 C)  Resp: [id number] PA-C 02/17/15 0010  Alvira Monday, MD 02/21/15 1357

## 2015-09-30 ENCOUNTER — Emergency Department (HOSPITAL_COMMUNITY): Payer: Self-pay

## 2015-09-30 ENCOUNTER — Emergency Department (HOSPITAL_COMMUNITY)
Admission: EM | Admit: 2015-09-30 | Discharge: 2015-10-01 | Disposition: A | Discharge status: Home / Self Care | Payer: Self-pay | Attending: Emergency Medicine | Admitting: Emergency Medicine

## 2015-09-30 DIAGNOSIS — E876 Hypokalemia: Secondary | ICD-10-CM | POA: Insufficient documentation

## 2015-09-30 DIAGNOSIS — D509 Iron deficiency anemia, unspecified: Secondary | ICD-10-CM | POA: Insufficient documentation

## 2015-09-30 DIAGNOSIS — R002 Palpitations: Secondary | ICD-10-CM

## 2015-09-30 NOTE — ED Notes (Signed)
Patient transported to X-ray 

## 2015-09-30 NOTE — ED Triage Notes (Signed)
Pt states that she had an episode of palpitations and SOB today at work. Took her pulse and it was over 100. Denies CP. HR 84 now. Alert and oriented.

## 2015-10-01 LAB — CBC WITH DIFFERENTIAL/PLATELET
BASOS ABS: 0 10*3/uL (ref 0.0–0.1)
BASOS PCT: 0 %
EOS PCT: 2 %
Eosinophils Absolute: 0.2 10*3/uL (ref 0.0–0.7)
HEMATOCRIT: 29.4 % — AB (ref 36.0–46.0)
HEMOGLOBIN: 9 g/dL — AB (ref 12.0–15.0)
LYMPHS ABS: 2.8 10*3/uL (ref 0.7–4.0)
Lymphocytes Relative: 28 %
MCH: 22.9 pg — AB (ref 26.0–34.0)
MCHC: 30.6 g/dL (ref 30.0–36.0)
MCV: 74.8 fL — AB (ref 78.0–100.0)
MONOS PCT: 11 %
Monocytes Absolute: 1.1 10*3/uL — ABNORMAL HIGH (ref 0.1–1.0)
NEUTROS ABS: 5.8 10*3/uL (ref 1.7–7.7)
Neutrophils Relative %: 59 %
Platelets: 317 10*3/uL (ref 150–400)
RBC: 3.93 MIL/uL (ref 3.87–5.11)
RDW: 17.5 % — ABNORMAL HIGH (ref 11.5–15.5)
WBC: 9.9 10*3/uL (ref 4.0–10.5)

## 2015-10-01 LAB — BASIC METABOLIC PANEL
Anion gap: 7 (ref 5–15)
BUN: 10 mg/dL (ref 6–20)
CO2: 26 mmol/L (ref 22–32)
Calcium: 8.7 mg/dL — ABNORMAL LOW (ref 8.9–10.3)
Chloride: 105 mmol/L (ref 101–111)
Creatinine, Ser: 0.68 mg/dL (ref 0.44–1.00)
GFR calc Af Amer: >60 mL/min (ref >60)
GLUCOSE: 88 mg/dL (ref 65–99)
POTASSIUM: 3.4 mmol/L — AB (ref 3.5–5.1)
Sodium: 138 mmol/L (ref 135–145)

## 2015-10-01 LAB — TROPONIN I: Troponin I: <0.03 ng/mL (ref <0.03)

## 2015-10-01 MED ORDER — POTASSIUM CHLORIDE CRYS ER 20 MEQ PO TBCR
40.0000 meq | EXTENDED_RELEASE_TABLET | Freq: Once | ORAL | Status: AC
  Administered 2015-10-01: 40 meq via ORAL
  Filled 2015-10-01: qty 2

## 2015-10-01 MED ORDER — POTASSIUM CHLORIDE CRYS ER 20 MEQ PO TBCR: 20.0000 meq | EXTENDED_RELEASE_TABLET | Freq: Two times a day (BID) | ORAL | 0 refills | Status: AC

## 2015-10-01 MED FILL — KLOR-CON M20 TABLET: 20 | 5 days supply | Qty: 10 | Fill #0

## 2015-10-01 NOTE — ED Notes (Signed)
Pt requested to not have an IV and only have blood drawn by a butterfly needle.

## 2015-10-01 NOTE — ED Provider Notes (Signed)
[facility]-EMERGENCY DEPT Provider Note   CSN: [id number] Arrival date & time: 09/30/15  2049  First Provider Contact:  None       History   Chief Complaint Chief Complaint  Patient presents with  . Palpitations    HPI Karen [name] is a 35 y.o. female.  The history is provided by the patient.   She was at work this evening when she noted that her heart was racing. She took her blood pressure and the machines showed her heart rate at 104. There was 1 period of time when she developed some tightness in her chest with some radiation up to her throat. The rapid heartbeat continued for approximately 3 hours before subsiding spontaneously. There is no associated dyspnea, nausea, diaphoresis. Nothing seemed to make it better nothing seemed to make it worse. While in the emergency department waiting room, the rapid heartbeat recurred only to resolve spontaneously. She has not had the rapid heartbeats and she has been out of the heart monitor. She does drink one large [employer] coffee every morning and did so today. No other caffeine ingestion. She is a nonsmoker and nondrinker.  Past Medical History:  Diagnosis Date  . No pertinent past medical history   . Obese     Patient Active Problem List   Diagnosis Date Noted  . Cesarean delivery, without mention of indication, delivered, with or without mention of antepartum condition 02/01/2011    Past Surgical History:  Procedure Laterality Date  . CESAREAN SECTION    . CESAREAN SECTION  02/01/2011   Procedure: CESAREAN SECTION;  Surgeon: Roseanna Rainbow, MD;  Location: [facility];  Service: Gynecology;  Laterality: N/A;  Repeat cesarean section with delivery of baby boy at 37. Apgars 9/9.    OB History    Gravida Para Term Preterm AB Living   4 3 1 2 1 3    SAB TAB Ectopic Multiple Live Births   1 0 0 0 2       Home Medications    Prior to Admission medications   Not on File    Family History No family history on  file.  Social History Social History  Substance Use Topics  . Smoking status: Never Smoker  . Smokeless tobacco: Never Used  . Alcohol use No     Allergies   Review of patient's allergies indicates no known allergies.   Review of Systems Review of Systems  All other systems reviewed and are negative.    Physical Exam Updated Vital Signs BP 95/74   Pulse 72   Temp 98.8 F (37.1 C) (Oral)   Resp 15   LMP 09/23/2015 (Approximate)   SpO2 98%   Physical Exam  Nursing note and vitals reviewed.  35 year old female, resting comfortably and in no acute distress. Vital signs are normal. Oxygen saturation is 98%, which is normal. Head is normocephalic and atraumatic. PERRLA, EOMI. Oropharynx is clear. Neck is nontender and supple without adenopathy or JVD. Back is nontender and there is no CVA tenderness. Lungs are clear without rales, wheezes, or rhonchi. Chest is nontender. Heart has regular rate and rhythm without murmur. Abdomen is soft, flat, nontender without masses or hepatosplenomegaly and peristalsis is normoactive. Extremities have no cyanosis or edema, full range of motion is present. Skin is warm and dry without rash. Neurologic: Mental status is normal, cranial nerves are intact, there are no motor or sensory deficits.   ED Treatments / Results  Labs (all labs ordered are  listed, but only abnormal results are displayed) Labs Reviewed  BASIC METABOLIC PANEL - Abnormal; Notable for the following:       Result Value   Potassium 3.4 (*)    Calcium 8.7 (*)    All other components within normal limits  CBC WITH DIFFERENTIAL/PLATELET - Abnormal; Notable for the following:    Hemoglobin 9.0 (*)    HCT 29.4 (*)    MCV 74.8 (*)    MCH 22.9 (*)    RDW 17.5 (*)    Monocytes Absolute 1.1 (*)    All other components within normal limits  TROPONIN I    EKG  EKG Interpretation  Date/Time:  Monday September 30 2015 21:16:28 EDT Ventricular Rate:  76 PR Interval:     QRS Duration: 78 QT Interval:  365 QTC Calculation: 411 R Axis:   59 Text Interpretation:  Sinus rhythm Normal ECG No old tracing to compare Confirmed by [facility]  MD, Carrieanne Kleen ([id number]) on 10/01/2015 1:06:08 AM       Radiology Dg Chest 2 View  Result Date: 09/30/2015 CLINICAL DATA:  35 year old female with chest palpitation EXAM: CHEST  2 VIEW COMPARISON:  None. FINDINGS: The heart size and mediastinal contours are within normal limits. Both lungs are clear. The visualized skeletal structures are unremarkable. IMPRESSION: No active cardiopulmonary disease. Electronically Signed   By: Elgie Collard M.D.   On: 09/30/2015 21:31    Procedures Procedures (including critical care time)  Medications Ordered in ED Medications  potassium chloride SA (K-DUR,KLOR-CON) CR tablet 40 mEq (not administered)     Initial Impression / Assessment and Plan / ED Course  I have reviewed the triage vital signs and the nursing notes.  Pertinent labs & imaging results that were available during my care of the patient were reviewed by me and considered in my medical decision making (see chart for details).  Clinical Course    Episodes of palpitations. ECG is normal. Will check electrolytes. In the meantime, she will be kept on a heart monitor. Anticipate discharge with referral to cardiology for outpatient cardiac monitoring. Old records are reviewed, and she has no relevant past visits.  No arrhythmia seen in the ED. Workup is significant for mild hypokalemia. I doubt that this is significant, but will give short course of oral potassium at home to corrected. She has chronic anemia which is X improved over baseline. She is referred to cardiology for evaluation.  Final Clinical Impressions(s) / ED Diagnoses   Final diagnoses:  Palpitations  Hypokalemia  Microcytic anemia    New Prescriptions New Prescriptions   POTASSIUM CHLORIDE SA (K-DUR,KLOR-CON) 20 MEQ TABLET    Take 1 tablet (20 mEq total) by  mouth 2 (two) times daily.     Dione Booze, MD 10/01/15 [phone]

## 2018-05-03 ENCOUNTER — Other Ambulatory Visit: Payer: Self-pay

## 2018-05-03 ENCOUNTER — Emergency Department (HOSPITAL_COMMUNITY): Payer: No Typology Code available for payment source

## 2018-05-03 ENCOUNTER — Emergency Department (HOSPITAL_COMMUNITY)
Admission: EM | Admit: 2018-05-03 | Discharge: 2018-05-03 | Disposition: A | Discharge status: Home / Self Care | Payer: No Typology Code available for payment source | Attending: Emergency Medicine | Admitting: Emergency Medicine

## 2018-05-03 ENCOUNTER — Encounter (HOSPITAL_COMMUNITY): Payer: Self-pay | Admitting: *Deleted

## 2018-05-03 DIAGNOSIS — Y92481 Parking lot as the place of occurrence of the external cause: Secondary | ICD-10-CM | POA: Diagnosis not present

## 2018-05-03 DIAGNOSIS — Y9389 Activity, other specified: Secondary | ICD-10-CM | POA: Insufficient documentation

## 2018-05-03 DIAGNOSIS — S8012XA Contusion of left lower leg, initial encounter: Secondary | ICD-10-CM | POA: Insufficient documentation

## 2018-05-03 DIAGNOSIS — Y998 Other external cause status: Secondary | ICD-10-CM | POA: Diagnosis not present

## 2018-05-03 DIAGNOSIS — S8992XA Unspecified injury of left lower leg, initial encounter: Secondary | ICD-10-CM | POA: Diagnosis present

## 2018-05-03 MED ORDER — NAPROXEN 500 MG PO TABS
500.0000 mg | ORAL_TABLET | Freq: Once | ORAL | Status: AC
  Administered 2018-05-03: 500 mg via ORAL
  Filled 2018-05-03: qty 1

## 2018-05-03 MED ORDER — NAPROXEN 500 MG PO TABS: 500.0000 mg | ORAL_TABLET | Freq: Two times a day (BID) | ORAL | 0 refills | Status: AC | PRN
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
## 2018-05-03 NOTE — Discharge Instructions (Signed)
It was my pleasure taking care of you today!   Naproxen as needed for pain.   Follow up with your primary care doctor or the orthopedist if you are not starting to feel better in one week.   Return to ER for new or worsening symptoms, any additional concerns.

## 2019-02-06 ENCOUNTER — Other Ambulatory Visit: Payer: Self-pay

## 2019-02-06 DIAGNOSIS — Z20822 Contact with and (suspected) exposure to covid-19: Secondary | ICD-10-CM

## 2019-02-08 LAB — NOVEL CORONAVIRUS, NAA: SARS-CoV-2, NAA: NOT DETECTED
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
